# Patient Record
Sex: Female | Born: 1990 | Race: Asian | Hispanic: No | Marital: Single | Smoking: Former smoker
Health system: Southern US, Community
[De-identification: ages and names within clinical notes are randomized; demographics above are authoritative.]

## PROBLEM LIST (undated history)

## (undated) ENCOUNTER — Inpatient Hospital Stay (HOSPITAL_COMMUNITY): Payer: Self-pay

## (undated) DIAGNOSIS — M5432 Sciatica, left side: Secondary | ICD-10-CM

## (undated) DIAGNOSIS — Z789 Other specified health status: Secondary | ICD-10-CM

## (undated) DIAGNOSIS — I499 Cardiac arrhythmia, unspecified: Secondary | ICD-10-CM

## (undated) HISTORY — DX: Other specified health status: Z78.9

## (undated) HISTORY — DX: Cardiac arrhythmia, unspecified: I49.9

## (undated) HISTORY — DX: Sciatica, left side: M54.32

## (undated) HISTORY — PX: NO PAST SURGERIES: SHX2092

---

## 2017-02-11 ENCOUNTER — Emergency Department (HOSPITAL_COMMUNITY)
Admission: EM | Admit: 2017-02-11 | Discharge: 2017-02-11 | Disposition: A | Discharge status: Home / Self Care | Payer: Self-pay | Attending: Emergency Medicine | Admitting: Emergency Medicine

## 2017-02-11 ENCOUNTER — Encounter (HOSPITAL_COMMUNITY): Payer: Self-pay

## 2017-02-11 DIAGNOSIS — R21 Rash and other nonspecific skin eruption: Secondary | ICD-10-CM

## 2017-02-11 DIAGNOSIS — L539 Erythematous condition, unspecified: Secondary | ICD-10-CM | POA: Insufficient documentation

## 2017-02-11 DIAGNOSIS — L299 Pruritus, unspecified: Secondary | ICD-10-CM | POA: Insufficient documentation

## 2017-02-11 MED ORDER — HYDROXYZINE HCL 25 MG PO TABS
25.0000 mg | ORAL_TABLET | Freq: Four times a day (QID) | ORAL | 0 refills | Status: DC
Start: 2017-02-11 — End: 2017-06-23

## 2017-02-11 MED ORDER — TRIAMCINOLONE ACETONIDE 0.1 % EX CREA: 1.0000 "application " | TOPICAL_CREAM | Freq: Two times a day (BID) | CUTANEOUS | 0 refills | Status: DC

## 2017-02-11 NOTE — Discharge Instructions (Signed)
Apply kenalog cream twice a day to help get rid of rash. Use Hydroxyzine every 6hrs as needed for itching. Please return to the ER if symptoms worsen, you have trouble breathing.

## 2017-02-11 NOTE — ED Triage Notes (Signed)
Pt endorses recently buying a puppy and thinks she has been getting bit by fleas. Pt states that she had small bug bites but now has rash on there left neck. Airway intact. NKA. VSS.

## 2017-02-11 NOTE — ED Provider Notes (Signed)
[facility]-EMERGENCY DEPT Provider Note   CSN: [id number] Arrival date & time: 02/11/17  1343     History   Chief Complaint Chief Complaint  Patient presents with  . Rash    HPI Veronica Hensley is a 26 y.o. female.  HPI   Veronica Hensley is a 26yo female with no significant past medical history who presents to the Emergency Department for evaluation of a rash. She states that rash began on the left side of her neck last week, shortly after she got a puppy. The rash is itchy and has spread around the neck, up to her ears. She has also noticed it on her stomach, back and on areas of her thighs. She lives in a house with many family members who have access to the puppy, none of which have developed a rash. Puppy does not seem to be itching himself and was cleared by a vet a few days ago. She denies new soaps, detergents, lotions, makeup products, or other topical products. She has not traveled outside the [place], nor has she spent time in the wilderness lately. No new medications. She does not have any known allergies, has not had a rash like this before. She denies shortness of breath, fever, chest pain, nausea, vomiting.  History reviewed. No pertinent past medical history.  There are no active problems to display for this patient.   History reviewed. No pertinent surgical history.  OB History    No data available       Home Medications    Prior to Admission medications   Medication Sig Start Date End Date Taking? Authorizing Provider  hydrOXYzine (ATARAX/VISTARIL) 25 MG tablet Take 1 tablet (25 mg total) by mouth every 6 (six) hours. 02/11/17   Kellie Shropshire, PA-C  triamcinolone cream (KENALOG) 0.1 % Apply 1 application topically 2 (two) times daily. 02/11/17   Kellie Shropshire, PA-C    Family History History reviewed. No pertinent family history.  Social History Social History  Substance Use Topics  . Smoking status: Never Smoker  . Smokeless tobacco: Not on file  . Alcohol use Yes       Comment: occ     Allergies   Patient has no known allergies.   Review of Systems Review of Systems  Constitutional: Negative for chills and fever.  HENT: Negative for ear pain and hearing loss.   Eyes: Negative for pain and discharge.  Respiratory: Negative for shortness of breath, wheezing and stridor.   Cardiovascular: Negative for chest pain.  Gastrointestinal: Negative for nausea and vomiting.  Musculoskeletal: Negative for arthralgias and myalgias.  Skin: Positive for rash (erythematous and located on neck, stomach, back, thighs).     Physical Exam Updated Vital Signs BP 117/79 (BP Location: Left Arm)   Pulse 67   Temp 98.3 F (36.8 C) (Oral)   Resp 17   Ht 5\' 6"  (1.676 m)   Wt 104.3 kg (230 lb)   LMP 02/04/2017 (Exact Date)   SpO2 99%   BMI 37.12 kg/m   Physical Exam  Constitutional: She appears well-developed and well-nourished. No distress.  HENT:  Head: Normocephalic and atraumatic.  Right Ear: External ear normal.  Left Ear: External ear normal.  Eyes: Right eye exhibits no discharge. Left eye exhibits no discharge.  Neck: Full passive range of motion without pain. Neck supple.  Dark, velvety area on the skin on the back of the neck.   Pulmonary/Chest: Effort normal. No respiratory distress.  Neurological: She is alert. Coordination  normal.  Skin: No abrasion, no bruising, no laceration and no petechiae noted. She is not diaphoretic.  Erythematous, patchy rash located on the left side of the neck which spreads to the right side of the neck. Small area of lichenification. See picture below. Red, spotted rash on middle portion of the abdomen. See picture.   Psychiatric: She has a normal mood and affect. Her behavior is normal.  Nursing note and vitals reviewed.          ED Treatments / Results  Labs (all labs ordered are listed, but only abnormal results are displayed) Labs Reviewed - No data to display  EKG  EKG Interpretation None        Radiology No results found.  Procedures Procedures (including critical care time)  Medications Ordered in ED Medications - No data to display   Initial Impression / Assessment and Plan / ED Course  I have reviewed the triage vital signs and the nursing notes.  Pertinent labs & imaging results that were available during my care of the patient were reviewed by me and considered in my medical decision making (see chart for details).   [name] presented to the ER for evaluation of erythematous, pruritic rash located on the neck and stomach which developed 7 days ago and has been worsening. VSS, no fever or shortness of breath. She denies allergies or recent exposure to new medications or topical agents. This is likely a contact dermatitis that will be responsive to corticosteroids. It is not target like to suggest an arthropod exposure. Furthermore there are no spots with central clearing to suggest fungal exposure. Patient given topical steroid cream to apply twice daily for rash and hydroxyzine as needed for itching.   Final Clinical Impressions(s) / ED Diagnoses   Final diagnoses:  Rash and nonspecific skin eruption    New Prescriptions New Prescriptions   HYDROXYZINE (ATARAX/VISTARIL) 25 MG TABLET    Take 1 tablet (25 mg total) by mouth every 6 (six) hours.   TRIAMCINOLONE CREAM (KENALOG) 0.1 %    Apply 1 application topically 2 (two) times daily.     Kellie ShropshireShrosbree, [name], PA-C 02/11/17 1644    Arby BarrettePfeiffer, Marcy, MD 02/19/17 [id number]

## 2017-06-23 ENCOUNTER — Encounter (HOSPITAL_COMMUNITY): Payer: Self-pay | Admitting: Emergency Medicine

## 2017-06-23 ENCOUNTER — Other Ambulatory Visit: Payer: Self-pay

## 2017-06-23 ENCOUNTER — Emergency Department (HOSPITAL_COMMUNITY)
Admission: EM | Admit: 2017-06-23 | Discharge: 2017-06-23 | Disposition: A | Discharge status: Home / Self Care | Payer: Self-pay | Attending: Emergency Medicine | Admitting: Emergency Medicine

## 2017-06-23 DIAGNOSIS — L0100 Impetigo, unspecified: Secondary | ICD-10-CM | POA: Insufficient documentation

## 2017-06-23 DIAGNOSIS — L219 Seborrheic dermatitis, unspecified: Secondary | ICD-10-CM | POA: Insufficient documentation

## 2017-06-23 MED ORDER — TRIAMCINOLONE ACETONIDE 0.025 % EX OINT: 1.0000 "application " | TOPICAL_OINTMENT | Freq: Two times a day (BID) | CUTANEOUS | 0 refills | Status: DC

## 2017-06-23 MED ORDER — CEPHALEXIN 250 MG PO CAPS: 250.0000 mg | ORAL_CAPSULE | Freq: Four times a day (QID) | ORAL | 0 refills | Status: DC

## 2017-06-23 MED ORDER — OXICONAZOLE NITRATE 1 % EX CREA: TOPICAL_CREAM | CUTANEOUS | 0 refills | Status: DC

## 2017-06-23 NOTE — ED Provider Notes (Signed)
[facility]   CSN: [id number] Arrival date & time: 06/23/17  1312     History   Chief Complaint Chief Complaint  Patient presents with  . Rash    HPI Veronica Hensley is a 26 y.o. female resents to the emergency department with a chief complaint of rash that began on her neck 4 days ago before spreading to her face and scalp 3 days ago.  She also notes the rash is over the dorsum of her bilateral hands.  She denies rash over the trunk, legs, arms except for the hands, palms or soles.  It is pruritic, but not painful.  No history of similar.  She is treated her symptoms by putting diluted tea tree oil over the rash without improvement.  She denies fever, chills, URI symptoms, or recent illness.  No contacts with similar rash.  No new soaps, detergents, foods, sleeping environments, or hygiene products.  She is not immunocompromised.  No family history of inflammatory or rheumatological conditions.  The history is provided by the patient. No language interpreter was used.    History reviewed. No pertinent past medical history.  There are no active problems to display for this patient.   History reviewed. No pertinent surgical history.  OB History    No data available       Home Medications    Prior to Admission medications   Medication Sig Start Date End Date Taking? Authorizing Provider  cephALEXin (KEFLEX) 250 MG capsule Take 1 capsule (250 mg total) by mouth 4 (four) times daily for 7 days. 06/23/17 06/30/17  Ludmila Ebarb A, PA-C  oxiconazole (OXISTAT) 1 % CREA Apply a thin film twice per day. 06/23/17   Posie Lillibridge A, PA-C  triamcinolone (KENALOG) 0.025 % ointment Apply 1 application topically 2 (two) times daily. 06/23/17   Hannan Tetzlaff, Pedro EarlsMia A, PA-C    Family History No family history on file.  Social History Social History   Tobacco Use  . Smoking status: Never Smoker  . Smokeless tobacco: Never Used  Substance Use  Topics  . Alcohol use: Yes    Comment: occ  . Drug use: No     Allergies   Patient has no known allergies.   Review of Systems Review of Systems  Constitutional: Negative for chills and fever.  HENT: Negative for congestion, ear pain, rhinorrhea and sore throat.   Respiratory: Negative for shortness of breath.   Skin: Positive for rash.  Allergic/Immunologic: Negative for immunocompromised state.     Physical Exam Updated Vital Signs BP 118/73 (BP Location: Right Arm)   Pulse (!) 59   Temp 97.8 F (36.6 C) (Oral)   Resp 16   Ht 5\' 6"  (1.676 m)   Wt 104.3 kg (230 lb)   LMP 05/30/2017   SpO2 100%   BMI 37.12 kg/m   Physical Exam  Constitutional: No distress.  HENT:  Head: Normocephalic.    See pictures below.  Eyes: Conjunctivae are normal.  Neck: Neck supple.  Cardiovascular: Normal rate and regular rhythm. Exam reveals no gallop and no friction rub.  No murmur heard. Pulmonary/Chest: Effort normal. No respiratory distress.  Abdominal: Soft. She exhibits no distension.  Neurological: She is alert.  Skin: Skin is warm. Rash noted.  Psychiatric: Her behavior is normal.  Nursing note and vitals reviewed.   Left neck   Face   Right neck    Posterior neck     ED Treatments / Results  Labs (all labs ordered are listed, but only abnormal results are displayed) Labs Reviewed - No data to display  EKG  EKG Interpretation None       Radiology No results found.  Procedures Procedures (including critical care time)  Medications Ordered in ED Medications - No data to display   Initial Impression / Assessment and Plan / ED Course  I have reviewed the triage vital signs and the nursing notes.  Pertinent labs & imaging results that were available during my care of the patient were reviewed by me and considered in my medical decision making (see chart for details).     26 year old female presenting with a rash to the neck, face, scalp, and  dorsum of the bilateral hands.  The rash is only present on areas that have not been covered by clothing for the last few days.  She states there has been a scaling plaque to the left neck that has come and gone for some time.  Patient was discussed with Dr. Erma HeritageIsaacs, attending physician.  The rash looks more consistent for 2 separate rashes, impetigo and seborrheic dermatitis.  Will discharge with Keflex for impetigo and oxiconazole and triamcinolone cream as well as a selenium shampoo for seborrheic dermatitis.  Will also provide follow-up for dermatology if her rash is not improved.  Strict return precautions given.  No acute distress.  Patient is safe for discharge at this time.  Final Clinical Impressions(s) / ED Diagnoses   Final diagnoses:  Impetigo  Dermatitis, seborrheic    ED Discharge Orders        Ordered    cephALEXin (KEFLEX) 250 MG capsule  4 times daily     06/23/17 1626    oxiconazole (OXISTAT) 1 % CREA     06/23/17 1626    triamcinolone (KENALOG) 0.025 % ointment  2 times daily     06/23/17 1626       Matias Thurman A, PA-C 06/23/17 Berna [id number]    Shaune PollackIsaacs, Cameron, MD 06/24/17 [phone]

## 2017-06-23 NOTE — Discharge Instructions (Signed)
Your rash appears consistent with 2 different types of rashes.  Please take 1 tablet of Keflex every 6 hours for the next 7 days.  This is an antibiotic that is used to treat impetigo.   For the rash that is not red around the eyes and is also present on the neck there are 2 creams to use for this.  Please apply a thin film of oxiconazole and a thin layer of triamcinolone cream to the rash twice daily until it improves.  Please make sure that you do not get this cream in or around the eyes.  For the scalp, you can purchase that shampoo over-the-counter such as head and shoulders or another shampoo that is designed for dandruff that contains selenium as an active ingredient.  Use as directed on the bottle.  If you develop new or worsening symptoms including, changes to your vision, if it feels like there are changes in her hearing, fever, chills, or if the rash significantly worsens, please return to the emergency department for reevaluation.  If the rash does not seem as if it starting to improve within the next week, please call to schedule a follow-up appointment with dermatology.

## 2017-06-23 NOTE — ED Triage Notes (Signed)
Rash started on face on Sunday-- has gotten worse, now is on back of neck -- itching, has been taking benadryl -- last dose at 3am, 50mg . Eyes swollen, , rash on face, back of neck. Pt has not changed soap, lotion, make up,

## 2017-06-25 ENCOUNTER — Emergency Department (HOSPITAL_COMMUNITY)
Admission: EM | Admit: 2017-06-25 | Discharge: 2017-06-25 | Disposition: A | Discharge status: Home / Self Care | Payer: Self-pay | Attending: Emergency Medicine | Admitting: Emergency Medicine

## 2017-06-25 ENCOUNTER — Encounter (HOSPITAL_COMMUNITY): Payer: Self-pay | Admitting: *Deleted

## 2017-06-25 ENCOUNTER — Other Ambulatory Visit: Payer: Self-pay

## 2017-06-25 DIAGNOSIS — Y829 Unspecified medical devices associated with adverse incidents: Secondary | ICD-10-CM | POA: Insufficient documentation

## 2017-06-25 DIAGNOSIS — R600 Localized edema: Secondary | ICD-10-CM | POA: Insufficient documentation

## 2017-06-25 DIAGNOSIS — L299 Pruritus, unspecified: Secondary | ICD-10-CM | POA: Insufficient documentation

## 2017-06-25 DIAGNOSIS — T7840XA Allergy, unspecified, initial encounter: Secondary | ICD-10-CM

## 2017-06-25 DIAGNOSIS — T887XXA Unspecified adverse effect of drug or medicament, initial encounter: Secondary | ICD-10-CM | POA: Insufficient documentation

## 2017-06-25 LAB — CBC WITH DIFFERENTIAL/PLATELET
BASOS ABS: 0 10*3/uL (ref 0.0–0.1)
Basophils Relative: 0 %
EOS ABS: 0.5 10*3/uL (ref 0.0–0.7)
EOS PCT: 5 %
HCT: 46.7 % — ABNORMAL HIGH (ref 36.0–46.0)
HEMOGLOBIN: 15.4 g/dL — AB (ref 12.0–15.0)
LYMPHS ABS: 2.3 10*3/uL (ref 0.7–4.0)
LYMPHS PCT: 24 %
MCH: 26 pg (ref 26.0–34.0)
MCHC: 33 g/dL (ref 30.0–36.0)
MCV: 78.8 fL (ref 78.0–100.0)
Monocytes Absolute: 0.6 10*3/uL (ref 0.1–1.0)
Monocytes Relative: 6 %
Neutro Abs: 6.4 10*3/uL (ref 1.7–7.7)
Neutrophils Relative %: 65 %
PLATELETS: 257 10*3/uL (ref 150–400)
RBC: 5.93 MIL/uL — AB (ref 3.87–5.11)
RDW: 14.5 % (ref 11.5–15.5)
WBC: 9.8 10*3/uL (ref 4.0–10.5)

## 2017-06-25 LAB — BASIC METABOLIC PANEL
Anion gap: 14 (ref 5–15)
BUN: 10 mg/dL (ref 6–20)
CHLORIDE: 105 mmol/L (ref 101–111)
CO2: 19 mmol/L — ABNORMAL LOW (ref 22–32)
CREATININE: 0.7 mg/dL (ref 0.44–1.00)
Calcium: 9.7 mg/dL (ref 8.9–10.3)
GFR calc Af Amer: >60 mL/min (ref >60)
Glucose, Bld: 89 mg/dL (ref 65–99)
POTASSIUM: 3.7 mmol/L (ref 3.5–5.1)
SODIUM: 138 mmol/L (ref 135–145)

## 2017-06-25 LAB — I-STAT BETA HCG BLOOD, ED (MC, WL, AP ONLY)

## 2017-06-25 MED ORDER — DIPHENHYDRAMINE HCL 50 MG/ML IJ SOLN
25.0000 mg | Freq: Once | INTRAMUSCULAR | Status: DC
  Filled 2017-06-25: qty 1

## 2017-06-25 MED ORDER — DIPHENHYDRAMINE HCL 25 MG PO CAPS
25.0000 mg | ORAL_CAPSULE | Freq: Once | ORAL | Status: AC
  Administered 2017-06-25: 25 mg via ORAL
  Filled 2017-06-25: qty 1

## 2017-06-25 MED ORDER — PREDNISONE 20 MG PO TABS: ORAL_TABLET | ORAL | 0 refills | Status: DC

## 2017-06-25 MED ORDER — DOXYCYCLINE HYCLATE 100 MG PO CAPS: 100.0000 mg | ORAL_CAPSULE | Freq: Two times a day (BID) | ORAL | 0 refills | Status: DC

## 2017-06-25 MED ORDER — PREDNISONE 20 MG PO TABS
60.0000 mg | ORAL_TABLET | Freq: Once | ORAL | Status: AC
  Administered 2017-06-25: 60 mg via ORAL
  Filled 2017-06-25: qty 3

## 2017-06-25 MED ORDER — FAMOTIDINE 20 MG PO TABS
20.0000 mg | ORAL_TABLET | Freq: Once | ORAL | Status: AC
  Administered 2017-06-25: 20 mg via ORAL
  Filled 2017-06-25: qty 1

## 2017-06-25 MED ORDER — DIPHENHYDRAMINE HCL 25 MG PO TABS: 25.0000 mg | ORAL_TABLET | Freq: Four times a day (QID) | ORAL | 0 refills | Status: DC

## 2017-06-25 NOTE — Discharge Instructions (Signed)
Your facial swelling may be due to an allergic reaction.  Please discontinue Keflex and take doxycycline as prescribed.  Take prednisone and benadryl to help with the reaction.  Follow up with dermatologist if your condition does not improve.  Return if your condition worsen or if you have trouble breathing.

## 2017-06-25 NOTE — ED Provider Notes (Signed)
[facility] Provider Note   CSN: [id number] Arrival date & time: 06/25/17  1429     History   Chief Complaint Chief Complaint  Patient presents with  . Allergic Reaction    HPI Veronica Hensley is a 26 y.o. female.  HPI   26 year old female presenting for symptom of a rash.  Patient report 6 days ago she developed an slightly itchy rash since swelling to the back of her neck, and torso phased.  She also notes that on the dorsum of her hand.  It is not painful.  She did not know of anything that may have precipitated the rash.  No change in soap, detergent, new pets, new medication.  She did try diluted tea tree oil over the rash without adequate improvement.  She denies any recent sickness and no one else at home with similar rash.  She was seen in the ER on the 25th of this month for her symptoms.  It was felt that the rash may be impetigo or seborrheic dermatitis.  Patient discharged home with Keflex, and oxiconazole and triamcinolone cream as well as selenium shampoo.  She has been using the medication as prescribed but returned today after noticing increasing swelling to her face especially around her eyes.  Also endorses itchiness.  No report of fever, lightheadedness, dizziness, chest pain, trouble breathing, tongue swelling, trouble swallowing, abdominal cramping, or joint pain.  She denies taking any other over-the-counter medication such as ibuprofen, BC powders, Goody's powders, or Tylenol.  History reviewed. No pertinent past medical history.  There are no active problems to display for this patient.   History reviewed. No pertinent surgical history.  OB History    No data available       Home Medications    Prior to Admission medications   Medication Sig Start Date End Date Taking? Authorizing Provider  cephALEXin (KEFLEX) 250 MG capsule Take 1 capsule (250 mg total) by mouth 4 (four) times daily for 7 days. 06/23/17 06/30/17   McDonald, Mia A, PA-C  oxiconazole (OXISTAT) 1 % CREA Apply a thin film twice per day. 06/23/17   McDonald, Mia A, PA-C  triamcinolone (KENALOG) 0.025 % ointment Apply 1 application topically 2 (two) times daily. 06/23/17   McDonald, Pedro EarlsMia A, PA-C    Family History No family history on file.  Social History Social History   Tobacco Use  . Smoking status: Never Smoker  . Smokeless tobacco: Never Used  Substance Use Topics  . Alcohol use: Yes    Comment: occ  . Drug use: No     Allergies   Patient has no known allergies.   Review of Systems Review of Systems  All other systems reviewed and are negative.    Physical Exam Updated Vital Signs BP 131/89 (BP Location: Right Arm)   Pulse 79   Temp 98.2 F (36.8 C) (Oral)   Resp 16   Ht 5\' 6"  (1.676 m)   Wt 104.3 kg (230 lb)   LMP 05/30/2017   SpO2 100%   BMI 37.12 kg/m   Physical Exam  Constitutional: She is oriented to person, place, and time. She appears well-developed and well-nourished. No distress.  Obese female resting comfortably in bed in no acute discomfort.  HENT:  Head: Atraumatic.  Mouth/Throat: Oropharynx is clear and moist.  Eyes: Conjunctivae and EOM are normal. Pupils are equal, round, and reactive to light.  Neck: Normal range of motion. Neck supple.  Cardiovascular: Normal rate.  Irregular heart rhythm without murmur rubs or gallops  Pulmonary/Chest: Effort normal and breath sounds normal. She has no wheezes.  Abdominal: Soft. There is no tenderness.  Neurological: She is alert and oriented to person, place, and time.  Skin: Rash (Maculopapular erythematous rash noted surrounding patient's neck, anterior face around both orbital region with moderate edema to face.  Similar rash noted to the dorsum of her hands) noted.  No rash on the palms of hands, or soles of the feet.  No mucosal involvement.  Psychiatric: She has a normal mood and affect.  Nursing note and vitals reviewed.    ED Treatments  / Results  Labs (all labs ordered are listed, but only abnormal results are displayed) Labs Reviewed  BASIC METABOLIC PANEL - Abnormal; Notable for the following components:      Result Value   CO2 19 (*)    All other components within normal limits  CBC WITH DIFFERENTIAL/PLATELET - Abnormal; Notable for the following components:   RBC 5.93 (*)    Hemoglobin 15.4 (*)    HCT 46.7 (*)    All other components within normal limits  I-STAT BETA HCG BLOOD, ED ([facility], [facility], [facility] ONLY)    EKG  EKG Interpretation None      Date: 06/25/2017  Rate: 65  Rhythm: normal sinus rhythm  QRS Axis: normal  Intervals: normal  ST/T Wave abnormalities: normal  Conduction Disutrbances: none  Narrative Interpretation:   Old EKG Reviewed: No significant changes noted     Radiology No results found.  Procedures Procedures (including critical care time)  Medications Ordered in ED Medications  predniSONE (DELTASONE) tablet 60 mg (60 mg Oral Given 06/25/17 1550)  famotidine (PEPCID) tablet 20 mg (20 mg Oral Given 06/25/17 1550)  diphenhydrAMINE (BENADRYL) capsule 25 mg (25 mg Oral Given 06/25/17 1550)     Initial Impression / Assessment and Plan / ED Course  I have reviewed the triage vital signs and the nursing notes.  Pertinent labs & imaging results that were available during my care of the patient were reviewed by me and considered in my medical decision making (see chart for details).     BP 131/75   Pulse 65   Temp 98.2 F (36.8 C) (Oral)   Resp 17   Ht 5\' 6"  (1.676 m)   Wt 104.3 kg (230 lb)   LMP 05/30/2017   SpO2 100%   BMI 37.12 kg/m    Final Clinical Impressions(s) / ED Diagnoses   Final diagnoses:  Allergic reaction to drug, initial encounter    ED Discharge Orders        Ordered    predniSONE (DELTASONE) 20 MG tablet     06/25/17 1913    diphenhydrAMINE (BENADRYL) 25 MG tablet  Every 6 hours     06/25/17 1913    doxycycline (VIBRAMYCIN) 100 MG capsule  2 times  daily     06/25/17 1913     5:45 PM Patient here with worsening swelling and worsening rash most noticeable to her face.  On exam, both eyelids are puffy and she has prominent rash across her face, neck, and dorsums of hands.  No rash in the trunk or chest.  No mucosal involvement, no anaphylactic reaction.  She was recently started on 3 different medication, Keflex, oxiconazole, and triamcinolone.  It is difficult to determine if this increased swelling is due to an allergic reaction from the new medication versus the precautions of her rash.  Patient started on prednisone,  Benadryl, and Pepcid.  Will monitor closely.  She had an abnormal heart rhythm on exam which I mentioned to her and she acknowledged that other providers have mentioned the same.  An EKG obtained today shown sinus rhythm without any concerning arrhythmia.  6:43 PM Patient has been monitored in the ER with some improvement of her symptoms.  The swelling on the face has decreased.  Patient reports she used Keflex and triamcinolone cream only.  She did not use oxiconazole.  Since patient still at risk for infection, and she potentially developed allergic reaction to Keflex, I recommend discontinue Keflex and will switch her to doxycycline for further treatment.  Her pregnancy test is negative.  Labs are reassuring.  Return precautions discussed.   Fayrene Helper, PA-C 06/25/17 Lessie Dings, MD 06/26/17 1520

## 2017-06-25 NOTE — ED Triage Notes (Signed)
Pt tx here for impetigo on Dec 25 and has been taking antibiotics.  Pt has been taking prescribed oral antibiotics.  Now eyes are almost completely shut.  Denies any difficulty breathing.

## 2017-06-25 NOTE — ED Notes (Signed)
ED Provider at bedside. 

## 2017-06-25 NOTE — ED Provider Notes (Signed)
Patient placed in Quick Look pathway, seen and evaluated for chief complaint of facial swelling/ allergic rxn.  Pertinent H&P findings include recently dx with impetigo, tx with keflex and kenalog, eyes swollen shut. No signs of anaphylaxis, no mucous membrane involvement.  Based on initial evaluation, labs are indicated and radiology studies are not indicated.  Patient counseled on process, plan, and necessity for staying for completing the evaluation    Arthor CaptainHarris, Devin Foskey, PA-C 06/25/17 1653    Margarita Grizzleay, Danielle, MD 06/26/17 [phone]

## 2018-02-04 ENCOUNTER — Encounter (HOSPITAL_COMMUNITY): Payer: Self-pay

## 2018-04-12 ENCOUNTER — Encounter: Payer: Self-pay | Admitting: Obstetrics & Gynecology

## 2018-04-26 ENCOUNTER — Ambulatory Visit (INDEPENDENT_AMBULATORY_CARE_PROVIDER_SITE_OTHER): Payer: Medicaid Other | Admitting: Advanced Practice Midwife

## 2018-04-26 ENCOUNTER — Encounter: Payer: Self-pay | Admitting: Advanced Practice Midwife

## 2018-04-26 ENCOUNTER — Other Ambulatory Visit (HOSPITAL_COMMUNITY)
Admission: RE | Admit: 2018-04-26 | Discharge: 2018-04-26 | Disposition: A | Discharge status: Home / Self Care | Payer: Medicaid Other | Source: Ambulatory Visit | Attending: Obstetrics & Gynecology | Admitting: Obstetrics & Gynecology

## 2018-04-26 VITALS — BP 118/80 | HR 50 | Wt 240.0 lb

## 2018-04-26 DIAGNOSIS — Z3A00 Weeks of gestation of pregnancy not specified: Secondary | ICD-10-CM | POA: Diagnosis not present

## 2018-04-26 DIAGNOSIS — O0932 Supervision of pregnancy with insufficient antenatal care, second trimester: Secondary | ICD-10-CM | POA: Diagnosis not present

## 2018-04-26 DIAGNOSIS — Z3490 Encounter for supervision of normal pregnancy, unspecified, unspecified trimester: Secondary | ICD-10-CM

## 2018-04-26 DIAGNOSIS — O219 Vomiting of pregnancy, unspecified: Secondary | ICD-10-CM

## 2018-04-26 DIAGNOSIS — I499 Cardiac arrhythmia, unspecified: Secondary | ICD-10-CM

## 2018-04-26 MED ORDER — DOXYLAMINE-PYRIDOXINE 10-10 MG PO TBEC
DELAYED_RELEASE_TABLET | ORAL | 5 refills | Status: DC
Start: 2018-04-26 — End: 2018-06-20

## 2018-04-26 NOTE — Patient Instructions (Addendum)

## 2018-04-26 NOTE — Progress Notes (Signed)
   PRENATAL VISIT NOTE  Subjective:  Veronica Hensley is a 27 y.o. G1P0 at [redacted]w[redacted]d being seen today for initial prenatal visit.  She is currently monitored for the following issues for this low-risk pregnancy and has Supervision of normal pregnancy on their problem list.  Patient reports nausea.  Contractions: Not present. Vag. Bleeding: None.  Movement: Absent. Denies leaking of fluid.   The following portions of the patient's history were reviewed and updated as appropriate: allergies, current medications, past family history, past medical history, past social history, past surgical history and problem list. Problem list updated.  Objective:   Vitals:   04/26/18 1102  BP: 118/80  Pulse: (!) 50  Weight: 108.9 kg    Fetal Status: Fetal Heart Rate (bpm): 145   Movement: Absent     VS reviewed, nursing note reviewed,  Constitutional: well developed, well nourished, no distress HEENT: normocephalic CV: normal rate Pulm/chest wall: normal effort Breast Exam:  right breast normal without mass, skin or nipple changes or axillary nodes, left breast normal without mass, skin or nipple changes or axillary nodes Abdomen: soft Neuro: alert and oriented x 3 Skin: warm, dry Psych: affect normal Pelvic exam: Cervix pink, visually closed, without lesion, scant white creamy discharge, vaginal walls and external genitalia normal Bimanual exam: Cervix 0/long/high, firm, anterior, neg CMT, uterus nontender, ~ 20 week size, adnexa without tenderness, enlargement, or mass   Assessment and Plan:  Pregnancy: G1P0 at [redacted]w[redacted]d  1. Encounter for supervision of normal pregnancy, antepartum, unspecified gravidity --Discussed and offered genetic screening options, including Quad screen/AFP, NIPS testing, and option to decline testing. Benefits/risks/alternatives reviewed. Pt aware that anatomy US is form of genetic screening with lower accuracy in detecting trisomies than blood work.  Pt chooses NIPS for genetic  screening today. --Anticipatory guidance about next visits/weeks of pregnancy given. - Obstetric Panel, Including HIV - Culture, OB Urine - Cytology - PAP - Genetic Screening - Hemoglobinopathy evaluation - Cystic Fibrosis Mutation 97 - SMN1 COPY NUMBER ANALYSIS (SMA Carrier Screen) - Enroll Patient in Babyscripts  2. Nausea in pregnancy --No vomiting but daily nausea --Rx for Diclegis, discussed starting with pm dose, add am and afternoon doses PRN.  3.  Irregular heartbeat (maternal) --Dynamap reads maternal HR as 42, manual pulse is 50. --Pt reports known low normal heartrate in the past --Pt asymptomatic and denies any SOB or chest pain.   --Auscultation of HR reveals mostly regular HR in low 50s but with some irregular or dropped beats, approximately 1 every 2 minutes.  This is likely pt baseline, sinus arrythmia vs PACs --Pt given precautions, reasons to return to care for further work-up  Preterm labor symptoms and general obstetric precautions including but not limited to vaginal bleeding, contractions, leaking of fluid and fetal movement were reviewed in detail with the patient. Please refer to After Visit Summary for other counseling recommendations.  No follow-ups on file.  No future appointments.  Sharen Counter, CNM

## 2018-04-27 LAB — CYTOLOGY - PAP
Adequacy: ABSENT
CHLAMYDIA, DNA PROBE: NEGATIVE
DIAGNOSIS: NEGATIVE
Neisseria Gonorrhea: NEGATIVE

## 2018-04-28 ENCOUNTER — Telehealth: Payer: Self-pay | Admitting: Advanced Practice Midwife

## 2018-04-28 ENCOUNTER — Telehealth: Payer: Self-pay

## 2018-04-28 LAB — OBSTETRIC PANEL, INCLUDING HIV
ANTIBODY SCREEN: NEGATIVE
BASOS: 1 %
Basophils Absolute: 0.1 10*3/uL (ref 0.0–0.2)
EOS (ABSOLUTE): 0.3 10*3/uL (ref 0.0–0.4)
Eos: 3 %
HIV Screen 4th Generation wRfx: NONREACTIVE
Hematocrit: 36.8 % (ref 34.0–46.6)
Hemoglobin: 11.8 g/dL (ref 11.1–15.9)
Hepatitis B Surface Ag: NEGATIVE
IMMATURE GRANS (ABS): 0.1 10*3/uL (ref 0.0–0.1)
IMMATURE GRANULOCYTES: 1 %
LYMPHS: 28 %
Lymphocytes Absolute: 2.8 10*3/uL (ref 0.7–3.1)
MCH: 25.2 pg — ABNORMAL LOW (ref 26.6–33.0)
MCHC: 32.1 g/dL (ref 31.5–35.7)
MCV: 79 fL (ref 79–97)
Monocytes Absolute: 0.7 10*3/uL (ref 0.1–0.9)
Monocytes: 7 %
NEUTROS PCT: 60 %
Neutrophils Absolute: 6 10*3/uL (ref 1.4–7.0)
Platelets: 268 10*3/uL (ref 150–450)
RBC: 4.68 x10E6/uL (ref 3.77–5.28)
RDW: 13.7 % (ref 12.3–15.4)
RPR Ser Ql: REACTIVE — AB
Rh Factor: POSITIVE
Rubella Antibodies, IGG: 1.96 index (ref >0.99)
WBC: 9.9 10*3/uL (ref 3.4–10.8)

## 2018-04-28 LAB — HEMOGLOBINOPATHY EVALUATION
HGB C: 0 %
HGB S: 0 %
HGB VARIANT: 0 %
Hemoglobin A2 Quantitation: 2.2 % (ref 1.8–3.2)
Hemoglobin F Quantitation: 0 % (ref 0.0–2.0)
Hgb A: 97.8 % (ref 96.4–98.8)

## 2018-04-28 LAB — RPR, QUANT+TP ABS (REFLEX): T Pallidum Abs: NEGATIVE

## 2018-04-28 NOTE — Telephone Encounter (Signed)
Attempt to call pt but phone not in services.  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  Need to find out if pt has ever been treated for syphilis so is this active new infection or old infection with elevated labs?  The pt needs to call to make appointment for treatment with [facility] at [phone].

## 2018-04-28 NOTE — Telephone Encounter (Signed)
-----   Message from Hurshel Party, CNM sent at 04/28/2018  3:31 PM EDT ----- Regarding: Positive syphilis test I saw this G1 on 10/28 for new OB visit. Her RPR is positive and the confirmatory test, TPAL, is also positive.  I attempted to call her but her phone is not in service.  I put a note in the chart and put it in her sticky note.  Can you please contact her again in case her phone is back on?  She needs to contact [facility] at [phone] for treatment.  If we can't reach her, we will let her know at her next prenatal visit.  Thank you.

## 2018-04-28 NOTE — Telephone Encounter (Signed)
Attempted to call patient. Phone is not in service.

## 2018-04-29 ENCOUNTER — Encounter (HOSPITAL_COMMUNITY): Payer: Self-pay

## 2018-04-29 LAB — URINE CULTURE, OB REFLEX

## 2018-04-29 LAB — CULTURE, OB URINE

## 2018-05-03 ENCOUNTER — Encounter: Payer: Self-pay | Admitting: Advanced Practice Midwife

## 2018-05-05 LAB — SMN1 COPY NUMBER ANALYSIS (SMA CARRIER SCREENING)

## 2018-05-05 LAB — CYSTIC FIBROSIS MUTATION 97: GENE DIS ANAL CARRIER INTERP BLD/T-IMP: NOT DETECTED

## 2018-05-06 ENCOUNTER — Other Ambulatory Visit (HOSPITAL_COMMUNITY): Payer: Self-pay | Admitting: *Deleted

## 2018-05-06 ENCOUNTER — Ambulatory Visit (HOSPITAL_COMMUNITY)
Admission: RE | Admit: 2018-05-06 | Discharge: 2018-05-06 | Disposition: A | Discharge status: Home / Self Care | Payer: Medicaid Other | Source: Ambulatory Visit | Attending: Advanced Practice Midwife | Admitting: Advanced Practice Midwife

## 2018-05-06 DIAGNOSIS — Z363 Encounter for antenatal screening for malformations: Secondary | ICD-10-CM | POA: Diagnosis not present

## 2018-05-06 DIAGNOSIS — Z362 Encounter for other antenatal screening follow-up: Secondary | ICD-10-CM

## 2018-05-06 DIAGNOSIS — Z3482 Encounter for supervision of other normal pregnancy, second trimester: Secondary | ICD-10-CM | POA: Diagnosis present

## 2018-05-06 DIAGNOSIS — O09212 Supervision of pregnancy with history of pre-term labor, second trimester: Secondary | ICD-10-CM | POA: Diagnosis not present

## 2018-05-06 DIAGNOSIS — Z3A22 22 weeks gestation of pregnancy: Secondary | ICD-10-CM | POA: Diagnosis not present

## 2018-05-06 DIAGNOSIS — Z3490 Encounter for supervision of normal pregnancy, unspecified, unspecified trimester: Secondary | ICD-10-CM

## 2018-05-08 ENCOUNTER — Encounter: Payer: Self-pay | Admitting: Advanced Practice Midwife

## 2018-05-08 DIAGNOSIS — R768 Other specified abnormal immunological findings in serum: Secondary | ICD-10-CM | POA: Insufficient documentation

## 2018-05-24 ENCOUNTER — Encounter: Payer: Self-pay | Admitting: Obstetrics

## 2018-05-24 ENCOUNTER — Ambulatory Visit (INDEPENDENT_AMBULATORY_CARE_PROVIDER_SITE_OTHER): Payer: Medicaid Other | Admitting: Obstetrics

## 2018-05-24 VITALS — BP 117/77 | HR 52 | Wt 245.8 lb

## 2018-05-24 DIAGNOSIS — Z3490 Encounter for supervision of normal pregnancy, unspecified, unspecified trimester: Secondary | ICD-10-CM

## 2018-05-24 DIAGNOSIS — M549 Dorsalgia, unspecified: Secondary | ICD-10-CM

## 2018-05-24 DIAGNOSIS — R768 Other specified abnormal immunological findings in serum: Secondary | ICD-10-CM

## 2018-05-24 DIAGNOSIS — B9689 Other specified bacterial agents as the cause of diseases classified elsewhere: Secondary | ICD-10-CM | POA: Insufficient documentation

## 2018-05-24 DIAGNOSIS — B961 Klebsiella pneumoniae [K. pneumoniae] as the cause of diseases classified elsewhere: Secondary | ICD-10-CM

## 2018-05-24 DIAGNOSIS — N390 Urinary tract infection, site not specified: Secondary | ICD-10-CM | POA: Insufficient documentation

## 2018-05-24 DIAGNOSIS — Z3492 Encounter for supervision of normal pregnancy, unspecified, second trimester: Secondary | ICD-10-CM

## 2018-05-24 MED ORDER — COMFORT FIT MATERNITY SUPP SM MISC: 0 refills | Status: DC

## 2018-05-24 MED ORDER — CEFUROXIME AXETIL 500 MG PO TABS: 500.0000 mg | ORAL_TABLET | Freq: Two times a day (BID) | ORAL | 0 refills | Status: DC

## 2018-05-24 NOTE — Progress Notes (Signed)
Patient reports good fetal movement with occasional uterine irritability.

## 2018-05-24 NOTE — Progress Notes (Signed)
   PRENATAL VISIT NOTE  Subjective:  Veronica Hensley is a 27 y.o. G1P0 at [redacted]w[redacted]d being seen today for ongoing prenatal care.  She is currently monitored for the following issues for this low-risk pregnancy and has Supervision of normal pregnancy and Biological false positive RPR test on their problem list.  Patient reports backache.  Contractions: Irritability. Vag. Bleeding: None.  Movement: Present. Denies leaking of fluid.   The following portions of the patient's history were reviewed and updated as appropriate: allergies, current medications, past family history, past medical history, past social history, past surgical history and problem list. Problem list updated.  Objective:   Vitals:   05/24/18 1122  BP: 117/77  Pulse: (!) 52  Weight: 245 lb 12.8 oz (111.5 kg)    Fetal Status: Fetal Heart Rate (bpm): 148   Movement: Present     General:  Alert, oriented and cooperative. Patient is in no acute distress.  Skin: Skin is warm and dry. No rash noted.   Cardiovascular: Normal heart rate noted  Respiratory: Normal respiratory effort, no problems with respiration noted  Abdomen: Soft, gravid, appropriate for gestational age.  Pain/Pressure: Absent     Pelvic: Cervical exam deferred        Extremities: Normal range of motion.  Edema: None  Mental Status: Normal mood and affect. Normal behavior. Normal judgment and thought content.   Assessment and Plan:  Pregnancy: G1P0 at 132w4d  1. Encounter for supervision of normal pregnancy, antepartum, unspecified gravidity - DOING WELL  2. Biological false positive RPR test  3. Urinary tract infection without hematuria, site unspecified Rx: - cefUROXime (CEFTIN) 500 MG tablet; Take 1 tablet (500 mg total) by mouth 2 (two) times daily with a meal.  Dispense: 14 tablet; Refill: 0  4. Backache without radiation Rx: - Elastic Bandages & Supports (COMFORT FIT MATERNITY SUPP SM) MISC; Wear as directed.  Dispense: 1 each; Refill: 0    Preterm  labor symptoms and general obstetric precautions including but not limited to vaginal bleeding, contractions, leaking of fluid and fetal movement were reviewed in detail with the patient. Please refer to After Visit Summary for other counseling recommendations.  Return in about 4 weeks (around 06/21/2018) for ROB, 2 hour OGTT.  Future Appointments  Date Time Provider Department Center  06/03/2018 10:45 AM [facility] US 2 [facility]    Coral Ceoharles Harper, MD

## 2018-05-24 NOTE — Patient Instructions (Addendum)
Second Trimester of Pregnancy The second trimester is from week 14 through week 27 (months 4 through 6). The second trimester is often a time when you feel your best. Your body has adjusted to being pregnant, and you begin to feel better physically. Usually, morning sickness has lessened or quit completely, you may have more energy, and you may have an increase in appetite. The second trimester is also a time when the fetus is growing rapidly. At the end of the sixth month, the fetus is about 9 inches long and weighs about 1 pounds. You will likely begin to feel the baby move (quickening) between 16 and 20 weeks of pregnancy. Body changes during your second trimester Your body continues to go through many changes during your second trimester. The changes vary from woman to woman.  Your weight will continue to increase. You will notice your lower abdomen bulging out.  You may begin to get stretch marks on your hips, abdomen, and breasts.  You may develop headaches that can be relieved by medicines. The medicines should be approved by your health care provider.  You may urinate more often because the fetus is pressing on your bladder.  You may develop or continue to have heartburn as a result of your pregnancy.  You may develop constipation because certain hormones are causing the muscles that push waste through your intestines to slow down.  You may develop hemorrhoids or swollen, bulging veins (varicose veins).  You may have back pain. This is caused by: ? Weight gain. ? Pregnancy hormones that are relaxing the joints in your pelvis. ? A shift in weight and the muscles that support your balance.  Your breasts will continue to grow and they will continue to become tender.  Your gums may bleed and may be sensitive to brushing and flossing.  Dark spots or blotches (chloasma, mask of pregnancy) may develop on your face. This will likely fade after the baby is born.  A dark line from your  belly button to the pubic area (linea nigra) may appear. This will likely fade after the baby is born.  You may have changes in your hair. These can include thickening of your hair, rapid growth, and changes in texture. Some women also have hair loss during or after pregnancy, or hair that feels dry or thin. Your hair will most likely return to normal after your baby is born.  What to expect at prenatal visits During a routine prenatal visit:  You will be weighed to make sure you and the fetus are growing normally.  Your blood pressure will be taken.  Your abdomen will be measured to track your baby's growth.  The fetal heartbeat will be listened to.  Any test results from the previous visit will be discussed.  Your health care provider may ask you:  How you are feeling.  If you are feeling the baby move.  If you have had any abnormal symptoms, such as leaking fluid, bleeding, severe headaches, or abdominal cramping.  If you are using any tobacco products, including cigarettes, chewing tobacco, and electronic cigarettes.  If you have any questions.  Other tests that may be performed during your second trimester include:  Blood tests that check for: ? Low iron levels (anemia). ? High blood sugar that affects pregnant women (gestational diabetes) between 24 and 28 weeks. ? Rh antibodies. This is to check for a protein on red blood cells (Rh factor).  Urine tests to check for infections, diabetes, or   protein in the urine.  An ultrasound to confirm the proper growth and development of the baby.  An amniocentesis to check for possible genetic problems.  Fetal screens for spina bifida and Down syndrome.  HIV (human immunodeficiency virus) testing. Routine prenatal testing includes screening for HIV, unless you choose not to have this test.  Follow these instructions at home: Medicines  Follow your health care provider's instructions regarding medicine use. Specific  medicines may be either safe or unsafe to take during pregnancy.  Take a prenatal vitamin that contains at least 600 micrograms (mcg) of folic acid.  If you develop constipation, try taking a stool softener if your health care provider approves. Eating and drinking  Eat a balanced diet that includes fresh fruits and vegetables, whole grains, good sources of protein such as meat, eggs, or tofu, and low-fat dairy. Your health care provider will help you determine the amount of weight gain that is right for you.  Avoid raw meat and uncooked cheese. These carry germs that can cause birth defects in the baby.  If you have low calcium intake from food, talk to your health care provider about whether you should take a daily calcium supplement.  Limit foods that are high in fat and processed sugars, such as fried and sweet foods.  To prevent constipation: ? Drink enough fluid to keep your urine clear or pale yellow. ? Eat foods that are high in fiber, such as fresh fruits and vegetables, whole grains, and beans. Activity  Exercise only as directed by your health care provider. Most women can continue their usual exercise routine during pregnancy. Try to exercise for 30 minutes at least 5 days a week. Stop exercising if you experience uterine contractions.  Avoid heavy lifting, wear low heel shoes, and practice good posture.  A sexual relationship may be continued unless your health care provider directs you otherwise. Relieving pain and discomfort  Wear a good support bra to prevent discomfort from breast tenderness.  Take warm sitz baths to soothe any pain or discomfort caused by hemorrhoids. Use hemorrhoid cream if your health care provider approves.  Rest with your legs elevated if you have leg cramps or low back pain.  If you develop varicose veins, wear support hose. Elevate your feet for 15 minutes, 3-4 times a day. Limit salt in your diet. Prenatal Care  Write down your questions.  Take them to your prenatal visits.  Keep all your prenatal visits as told by your health care provider. This is important. Safety  Wear your seat belt at all times when driving.  Make a list of emergency phone numbers, including numbers for family, friends, the hospital, and police and fire departments. General instructions  Ask your health care provider for a referral to a local prenatal education class. Begin classes no later than the beginning of month 6 of your pregnancy.  Ask for help if you have counseling or nutritional needs during pregnancy. Your health care provider can offer advice or refer you to specialists for help with various needs.  Do not use hot tubs, steam rooms, or saunas.  Do not douche or use tampons or scented sanitary pads.  Do not cross your legs for long periods of time.  Avoid cat litter boxes and soil used by cats. These carry germs that can cause birth defects in the baby and possibly loss of the fetus by miscarriage or stillbirth.  Avoid all smoking, herbs, alcohol, and unprescribed drugs. Chemicals in these products can   affect the formation and growth of the baby.  Do not use any products that contain nicotine or tobacco, such as cigarettes and e-cigarettes. If you need help quitting, ask your health care provider.  Visit your dentist if you have not gone yet during your pregnancy. Use a soft toothbrush to brush your teeth and be gentle when you floss. Contact a health care provider if:  You have dizziness.  You have mild pelvic cramps, pelvic pressure, or nagging pain in the abdominal area.  You have persistent nausea, vomiting, or diarrhea.  You have a bad smelling vaginal discharge.  You have pain when you urinate. Get help right away if:  You have a fever.  You are leaking fluid from your vagina.  You have spotting or bleeding from your vagina.  You have severe abdominal cramping or pain.  You have rapid weight gain or weight  loss.  You have shortness of breath with chest pain.  You notice sudden or extreme swelling of your face, hands, ankles, feet, or legs.  You have not felt your baby move in over an hour.  You have severe headaches that do not go away when you take medicine.  You have vision changes. Summary  The second trimester is from week 14 through week 27 (months 4 through 6). It is also a time when the fetus is growing rapidly.  Your body goes through many changes during pregnancy. The changes vary from woman to woman.  Avoid all smoking, herbs, alcohol, and unprescribed drugs. These chemicals affect the formation and growth your baby.  Do not use any tobacco products, such as cigarettes, chewing tobacco, and e-cigarettes. If you need help quitting, ask your health care provider.  Contact your health care provider if you have any questions. Keep all prenatal visits as told by your health care provider. This is important. This information is not intended to replace advice given to you by your health care provider. Make sure you discuss any questions you have with your health care provider. Document Released: 06/10/2001 Document Revised: 07/22/2016 Document Reviewed: 07/22/2016 Elsevier Interactive Patient Education  2018 [employer] Glucose Tolerance Test During Pregnancy The glucose tolerance test (GTT) is a blood test used to determine if you have developed a type of diabetes during pregnancy (gestational diabetes). This is when your body does not properly process sugar (glucose) in the food you eat, resulting in high blood glucose levels. Typically, a GTT is done after you have had a 1-hour glucose test with results that indicate you possibly have gestational diabetes. It may also be done if:  You have a history of giving birth to very large babies or have experienced repeated fetal loss (stillbirth).  You have signs and symptoms of diabetes, such as: ? Changes in your vision. ? Tingling or  numbness in your hands or feet. ? Changes in hunger, thirst, and urination not otherwise explained by your pregnancy.  The GTT lasts about 3 hours. You will be given a sugar-water solution to drink at the beginning of the test. You will have blood drawn before you drink the solution and then again 1, 2, and 3 hours after you drink it. You will not be allowed to eat or drink anything else during the test. You must remain at the testing location to make sure that your blood is drawn on time. You should also avoid exercising during the test, because exercise can alter test results. How do I prepare for this test? Eat normally for  3 days prior to the GTT test, including having plenty of carbohydrate-rich foods. Do not eat or drink anything except water during the final 12 hours before the test. In addition, your health care provider may ask you to stop taking certain medicines before the test. What do the results mean? It is your responsibility to obtain your test results. Ask the lab or department performing the test when and how you will get your results. Contact your health care provider to discuss any questions you have about your results. Range of Normal Values Ranges for normal values may vary among different labs and hospitals. You should always check with your health care provider after having lab work or other tests done to discuss whether your values are considered within normal limits. Normal levels of blood glucose are as follows:  Fasting: less than 105 mg/dL.  1 hour after drinking the solution: less than 190 mg/dL.  2 hours after drinking the solution: less than 165 mg/dL.  3 hours after drinking the solution: less than 145 mg/dL.  Some substances can interfere with GTT results. These may include:  Blood pressure and heart failure medicines, including beta blockers, furosemide, and thiazides.  Anti-inflammatory medicines, including aspirin.  Nicotine.  Some psychiatric  medicines.  Meaning of Results Outside Normal Value Ranges GTT test results that are above normal values may indicate a number of health problems, such as:  Gestational diabetes.  Acute stress response.  Cushing syndrome.  Tumors such as pheochromocytoma or glucagonoma.  Long-term kidney problems.  Pancreatitis.  Hyperthyroidism.  Current infection.  Discuss your test results with your health care provider. He or she will use the results to make a diagnosis and determine a treatment plan that is right for you. This information is not intended to replace advice given to you by your health care provider. Make sure you discuss any questions you have with your health care provider. Document Released: 12/16/2011 Document Revised: 11/22/2015 Document Reviewed: 10/21/2013 Elsevier Interactive Patient Education  2018 [employer]  

## 2018-06-03 ENCOUNTER — Ambulatory Visit (HOSPITAL_COMMUNITY)
Admission: RE | Admit: 2018-06-03 | Discharge: 2018-06-03 | Disposition: A | Discharge status: Home / Self Care | Payer: Medicaid Other | Source: Ambulatory Visit | Attending: Advanced Practice Midwife | Admitting: Advanced Practice Midwife

## 2018-06-03 DIAGNOSIS — Z3A24 24 weeks gestation of pregnancy: Secondary | ICD-10-CM | POA: Diagnosis not present

## 2018-06-03 DIAGNOSIS — Z362 Encounter for other antenatal screening follow-up: Secondary | ICD-10-CM | POA: Insufficient documentation

## 2018-06-03 DIAGNOSIS — O99212 Obesity complicating pregnancy, second trimester: Secondary | ICD-10-CM | POA: Diagnosis not present

## 2018-06-04 ENCOUNTER — Other Ambulatory Visit (HOSPITAL_COMMUNITY): Payer: Self-pay | Admitting: *Deleted

## 2018-06-04 DIAGNOSIS — Z362 Encounter for other antenatal screening follow-up: Secondary | ICD-10-CM

## 2018-06-20 ENCOUNTER — Inpatient Hospital Stay (HOSPITAL_COMMUNITY)
Admission: AD | Admit: 2018-06-20 | Discharge: 2018-06-20 | Disposition: A | Discharge status: Home / Self Care | Payer: Medicaid Other | Source: Ambulatory Visit | Attending: Obstetrics & Gynecology | Admitting: Obstetrics & Gynecology

## 2018-06-20 DIAGNOSIS — O99513 Diseases of the respiratory system complicating pregnancy, third trimester: Secondary | ICD-10-CM | POA: Diagnosis not present

## 2018-06-20 DIAGNOSIS — J101 Influenza due to other identified influenza virus with other respiratory manifestations: Secondary | ICD-10-CM

## 2018-06-20 DIAGNOSIS — Z3A28 28 weeks gestation of pregnancy: Secondary | ICD-10-CM

## 2018-06-20 DIAGNOSIS — R05 Cough: Secondary | ICD-10-CM | POA: Diagnosis present

## 2018-06-20 DIAGNOSIS — O26893 Other specified pregnancy related conditions, third trimester: Secondary | ICD-10-CM | POA: Diagnosis not present

## 2018-06-20 LAB — INFLUENZA PANEL BY PCR (TYPE A & B)
Influenza A By PCR: NEGATIVE
Influenza B By PCR: POSITIVE — AB

## 2018-06-20 LAB — GROUP A STREP BY PCR: Group A Strep by PCR: NOT DETECTED

## 2018-06-20 MED ORDER — OSELTAMIVIR PHOSPHATE 75 MG PO CAPS: 75.0000 mg | ORAL_CAPSULE | Freq: Two times a day (BID) | ORAL | 0 refills | Status: AC

## 2018-06-20 NOTE — Discharge Instructions (Signed)

## 2018-06-20 NOTE — MAU Note (Signed)
Coughing since Thursday and started to get worse on Saturday  Having occasional chills  Having some body aches that started today  Has a sore throat  Feels congested

## 2018-06-20 NOTE — MAU Note (Signed)
Provider Arbie CookeyLisa Leftwhich-Kirby stated pt does not need a NST.

## 2018-06-20 NOTE — MAU Provider Note (Signed)
Chief Complaint: Cough; Nasal Congestion; and Sore Throat   None     SUBJECTIVE HPI: Veronica Hensley is a 27 y.o. G1P0 at 1977w3d by LMP who presents to maternity admissions reporting cough, nasal congestion, and sore throat x 24 hours. She reports some chills and body aches associated with her symptoms. She has mild shortness of breath.  She denies any known exposure to illness.  She has not tried any treatments. There are no other symptoms. She is feeling normal fetal movement.      HPI  No past medical history on file. No past surgical history on file. Social History   Socioeconomic History  . Marital status: Single    Spouse name: Not on file  . Number of children: Not on file  . Years of education: Not on file  . Highest education level: Not on file  Occupational History  . Not on file  Social Needs  . Financial resource strain: Not on file  . Food insecurity:    Worry: Not on file    Inability: Not on file  . Transportation needs:    Medical: Not on file    Non-medical: Not on file  Tobacco Use  . Smoking status: Never Smoker  . Smokeless tobacco: Never Used  Substance and Sexual Activity  . Alcohol use: Not Currently    Comment: occ  . Drug use: No  . Sexual activity: Not on file  Lifestyle  . Physical activity:    Days per week: Not on file    Minutes per session: Not on file  . Stress: Not on file  Relationships  . Social connections:    Talks on phone: Not on file    Gets together: Not on file    Attends religious service: Not on file    Active member of club or organization: Not on file    Attends meetings of clubs or organizations: Not on file    Relationship status: Not on file  . Intimate partner violence:    Fear of current or ex partner: Not on file    Emotionally abused: Not on file    Physically abused: Not on file    Forced sexual activity: Not on file  Other Topics Concern  . Not on file  Social History Narrative  . Not on file   No current  facility-administered medications on file prior to encounter.    Current Outpatient Medications on File Prior to Encounter  Medication Sig Dispense Refill  . Elastic Bandages & Supports (COMFORT FIT MATERNITY SUPP SM) MISC Wear as directed. 1 each 0  . prenatal vitamin w/FE, FA (PRENATAL 1 + 1) 27-1 MG TABS tablet Take 1 tablet by mouth daily at 12 noon.     No Known Allergies  ROS:  Review of Systems  Constitutional: Positive for chills. Negative for fatigue and fever.  HENT: Positive for congestion, rhinorrhea and sore throat.   Eyes: Negative for visual disturbance.  Respiratory: Positive for shortness of breath.   Cardiovascular: Negative for chest pain.  Gastrointestinal: Negative for abdominal pain, nausea and vomiting.  Genitourinary: Negative for difficulty urinating, dysuria, flank pain, pelvic pain, vaginal bleeding, vaginal discharge and vaginal pain.  Neurological: Negative for dizziness and headaches.  Psychiatric/Behavioral: Negative.      I have reviewed patient's Past Medical Hx, Surgical Hx, Family Hx, Social Hx, medications and allergies.   Physical Exam   Patient Vitals for the past 24 hrs:  BP Temp Temp src Pulse Resp  Height Weight  06/20/18 1852 (!) 123/53 99.8 F (37.7 C) Oral (!) 58 18 5\' 6"  (1.676 m) 113.5 kg   O2 100% on RA  Constitutional: Well-developed, well-nourished female in no acute distress.  HEART: normal rate, heart sounds, regular rhythm RESP: normal effort, lung sounds clear and equal bilaterally GI: Abd soft, non-tender. Pos BS x 4 MS: Extremities nontender, no edema, normal ROM Neurologic: Alert and oriented x 4.  GU: Neg CVAT.   FHT 158 by doppler  LAB RESULTS Results for orders placed or performed during the hospital encounter of 06/20/18 (from the past 24 hour(s))  Influenza panel by PCR (type A & B)     Status: Abnormal   Collection Time: 06/20/18  7:00 PM  Result Value Ref Range   Influenza A By PCR NEGATIVE NEGATIVE    Influenza B By PCR POSITIVE (A) NEGATIVE  Group A Strep by PCR     Status: None   Collection Time: 06/20/18  7:00 PM  Result Value Ref Range   Group A Strep by PCR NOT DETECTED NOT DETECTED    A/Positive/-- (10/28 1139)  IMAGING   MAU Management/MDM: Ordered labs and reviewed results.  Pt with mild SOB but lung sounds clear, O2 sats 100% on RA.  Pt is influenza B positive.  Discussed results with pt.  Rx for Tamiflu and reviewed safe OTC cold meds in pregnancy.  Increase PO fluids and rest.  Return to MAU or ED if SOB worsens.  Will change pt prenatal appt tomorrow to a day later this week or next week.  Pt discharged with strict return precautions.  ASSESSMENT 1. Influenza B   2. Pregnancy with 28 completed weeks gestation     PLAN Discharge home Allergies as of 06/20/2018   No Known Allergies     Medication List    STOP taking these medications   cefUROXime 500 MG tablet Commonly known as:  CEFTIN   Doxylamine-Pyridoxine 10-10 MG Tbec Commonly known as:  DICLEGIS     TAKE these medications   COMFORT FIT MATERNITY SUPP SM Misc Wear as directed.   oseltamivir 75 MG capsule Commonly known as:  TAMIFLU Take 1 capsule (75 mg total) by mouth 2 (two) times daily for 5 days.   prenatal vitamin w/FE, FA 27-1 MG Tabs tablet Take 1 tablet by mouth daily at 12 noon.      Follow-up Information    [facility].   Why:  As scheduled, return to MAU as needed for emergencies  Contact information: [address] [place] [phone]          Sharen CounterLisa Leftwich-Kirby Certified Nurse-Midwife 06/21/2018  8:13 AM

## 2018-06-21 ENCOUNTER — Encounter: Payer: Medicaid Other | Admitting: Obstetrics

## 2018-06-21 ENCOUNTER — Telehealth: Payer: Self-pay | Admitting: Obstetrics

## 2018-06-21 ENCOUNTER — Other Ambulatory Visit: Payer: Medicaid Other

## 2018-06-21 NOTE — Telephone Encounter (Signed)
MAU called stating pt has the flu and is unable to keep appt today for GTT & provider. Given new appt lab only GTT 06/25/18 & appt [facility] 07/05/18.

## 2018-06-21 NOTE — Telephone Encounter (Signed)
Noted  

## 2018-06-24 ENCOUNTER — Ambulatory Visit (HOSPITAL_COMMUNITY): Payer: Medicaid Other

## 2018-06-25 ENCOUNTER — Other Ambulatory Visit: Payer: Medicaid Other

## 2018-06-30 NOTE — L&D Delivery Note (Signed)
Nurse call reports patient complete and with urge to push.  In room to assess. Cat I FT noted.  Patient with good pushing efforts.  Patient delivered as below with provider, FOB, and nurse encouragement.   Delivery Note At 6:04 PM, on September 16, 2018, a viable female was delivered via Vaginal, Spontaneous (Presentation: Occiput Anterior).  APGAR: 8, 9. Shoulders delivered easily and infant with good tone and spontaneous cry. Tactile stimulation given by provider and infant placed on mother's abdomen where nurse continued tactile stimulation. Cord clamped, cut, and blood collected. Placenta delivered spontaneously and noted to be intact with 3VC upon inspection.  Vaginal inspection revealed a left labial laceration that extended into the periclitoral area. Itwas repaired with 3-0 vicryl on SH.  No additional anesthetic necessary and patient tolerated the procedure well. Fundus firm, at the umbilicus, and bleeding small.  Mother hemodynamically stable and infant skin to skin prior to provider exit.  Mother desires Mirena for birth control and opts to breastfeed.  Infant weight at one hour of life: 5lbs 9.8oz, 18.5in.    Anesthesia:  Epidural Episiotomy: None Lacerations: Labial Suture Repair: 3.0 vicryl Est. Blood Loss (mL):  283  Mom to postpartum.  Baby to Couplet care / Skin to Skin.  Cherre Robins MSN, CNM 09/16/2018, 6:46 PM

## 2018-07-05 ENCOUNTER — Ambulatory Visit: Payer: Medicaid Other | Admitting: Obstetrics

## 2018-07-05 ENCOUNTER — Ambulatory Visit (HOSPITAL_COMMUNITY): Payer: Medicaid Other

## 2018-07-05 ENCOUNTER — Ambulatory Visit (HOSPITAL_COMMUNITY)
Admission: RE | Admit: 2018-07-05 | Discharge: 2018-07-05 | Disposition: A | Discharge status: Home / Self Care | Payer: Medicaid Other | Source: Ambulatory Visit | Attending: Obstetrics | Admitting: Obstetrics

## 2018-07-05 ENCOUNTER — Encounter: Payer: Self-pay | Admitting: Obstetrics

## 2018-07-05 DIAGNOSIS — Z362 Encounter for other antenatal screening follow-up: Secondary | ICD-10-CM

## 2018-07-05 DIAGNOSIS — O99213 Obesity complicating pregnancy, third trimester: Secondary | ICD-10-CM | POA: Diagnosis not present

## 2018-07-05 DIAGNOSIS — Z3A29 29 weeks gestation of pregnancy: Secondary | ICD-10-CM

## 2018-07-05 NOTE — Progress Notes (Signed)
Pt is here for follow up, but did not show up for her lab only visit on 12/27 so there was nothing to be done. Advised pt to come back for lab only visit fasting one morning this week.

## 2018-07-05 NOTE — Progress Notes (Signed)
Appointment cancelled because patient not fasted for lab test.  A/P:  [redacted] weeks gestation.  Follow up this week for 2 hour GTT - fasting.  Brock Bad MD 07-05-2018

## 2018-07-08 ENCOUNTER — Other Ambulatory Visit: Payer: Medicaid Other

## 2018-07-08 DIAGNOSIS — Z3490 Encounter for supervision of normal pregnancy, unspecified, unspecified trimester: Secondary | ICD-10-CM

## 2018-07-12 ENCOUNTER — Encounter: Payer: Self-pay | Admitting: Obstetrics

## 2018-07-12 ENCOUNTER — Ambulatory Visit (INDEPENDENT_AMBULATORY_CARE_PROVIDER_SITE_OTHER): Payer: Medicaid Other | Admitting: Obstetrics

## 2018-07-12 VITALS — BP 108/77 | HR 80 | Wt 246.2 lb

## 2018-07-12 DIAGNOSIS — Z87440 Personal history of urinary (tract) infections: Secondary | ICD-10-CM

## 2018-07-12 DIAGNOSIS — Z3493 Encounter for supervision of normal pregnancy, unspecified, third trimester: Secondary | ICD-10-CM

## 2018-07-12 DIAGNOSIS — Z3490 Encounter for supervision of normal pregnancy, unspecified, unspecified trimester: Secondary | ICD-10-CM

## 2018-07-12 DIAGNOSIS — Z3A31 31 weeks gestation of pregnancy: Secondary | ICD-10-CM

## 2018-07-12 MED ORDER — VITAFOL ULTRA 29-0.6-0.4-200 MG PO CAPS: 1.0000 | ORAL_CAPSULE | Freq: Every day | ORAL | 4 refills | Status: DC

## 2018-07-12 NOTE — Progress Notes (Signed)
Pt presents for ROB needs rf on PNV's.

## 2018-07-12 NOTE — Progress Notes (Signed)
Subjective:  Veronica Hensley is a 28 y.o. G1P0 at [redacted]w[redacted]d being seen today for ongoing prenatal care.  She is currently monitored for the following issues for this low-risk pregnancy and has Supervision of normal pregnancy; Biological false positive RPR test; and UTI due to Klebsiella species on their problem list.  Patient reports no complaints.  Contractions: Irregular. Vag. Bleeding: None.  Movement: Present. Denies leaking of fluid.   The following portions of the patient's history were reviewed and updated as appropriate: allergies, current medications, past family history, past medical history, past social history, past surgical history and problem list. Problem list updated.  Objective:   Vitals:   07/12/18 1500  Weight: 246 lb 3.2 oz (111.7 kg)    Fetal Status:     Movement: Present     General:  Alert, oriented and cooperative. Patient is in no acute distress.  Skin: Skin is warm and dry. No rash noted.   Cardiovascular: Normal heart rate noted  Respiratory: Normal respiratory effort, no problems with respiration noted  Abdomen: Soft, gravid, appropriate for gestational age. Pain/Pressure: Present     Pelvic:  Cervical exam deferred        Extremities: Normal range of motion.  Edema: None  Mental Status: Normal mood and affect. Normal behavior. Normal judgment and thought content.   Urinalysis:      Assessment and Plan:  Pregnancy: G1P0 at [redacted]w[redacted]d  1. Encounter for supervision of normal pregnancy, antepartum, unspecified gravidity Rx: - Prenat-Fe Poly-Methfol-FA-DHA (VITAFOL ULTRA) 29-0.6-0.4-200 MG CAPS; Take 1 capsule by mouth daily before breakfast.  Dispense: 90 capsule; Refill: 4  2. History of UTI - Klebsiella.  Treated. Rx: - Urine Culture for TOC  Preterm labor symptoms and general obstetric precautions including but not limited to vaginal bleeding, contractions, leaking of fluid and fetal movement were reviewed in detail with the patient. Please refer to After Visit  Summary for other counseling recommendations.  Return in about 2 weeks (around 07/26/2018) for ROB.   Brock Bad, MD

## 2018-07-13 LAB — HIV ANTIBODY (ROUTINE TESTING W REFLEX): HIV Screen 4th Generation wRfx: NONREACTIVE

## 2018-07-13 LAB — CBC
Hematocrit: 36.9 % (ref 34.0–46.6)
Hemoglobin: 12.1 g/dL (ref 11.1–15.9)
MCH: 25.5 pg — ABNORMAL LOW (ref 26.6–33.0)
MCHC: 32.8 g/dL (ref 31.5–35.7)
MCV: 78 fL — ABNORMAL LOW (ref 79–97)
Platelets: 292 10*3/uL (ref 150–450)
RBC: 4.75 x10E6/uL (ref 3.77–5.28)
RDW: 13.8 % (ref 11.7–15.4)
WBC: 9.9 10*3/uL (ref 3.4–10.8)

## 2018-07-13 LAB — GLUCOSE TOLERANCE, 2 HOURS W/ 1HR
Glucose, 1 hour: 107 mg/dL (ref 65–179)
Glucose, 2 hour: 95 mg/dL (ref 65–152)
Glucose, Fasting: 70 mg/dL (ref 65–91)

## 2018-07-13 LAB — RPR, QUANT+TP ABS (REFLEX): T Pallidum Abs: NONREACTIVE

## 2018-07-13 LAB — RPR: RPR Ser Ql: REACTIVE — AB

## 2018-07-14 LAB — URINE CULTURE

## 2018-07-15 ENCOUNTER — Other Ambulatory Visit: Payer: Self-pay | Admitting: Obstetrics

## 2018-07-15 DIAGNOSIS — N3000 Acute cystitis without hematuria: Secondary | ICD-10-CM

## 2018-07-15 MED ORDER — AMOXICILLIN-POT CLAVULANATE 875-125 MG PO TABS: 1.0000 | ORAL_TABLET | Freq: Two times a day (BID) | ORAL | 0 refills | Status: DC

## 2018-07-27 ENCOUNTER — Encounter: Payer: Medicaid Other | Admitting: Obstetrics

## 2018-08-03 ENCOUNTER — Other Ambulatory Visit: Payer: Self-pay

## 2018-08-03 ENCOUNTER — Ambulatory Visit (INDEPENDENT_AMBULATORY_CARE_PROVIDER_SITE_OTHER): Payer: Medicaid Other | Admitting: Obstetrics

## 2018-08-03 ENCOUNTER — Encounter: Payer: Self-pay | Admitting: Obstetrics

## 2018-08-03 VITALS — BP 126/75 | HR 47 | Wt 253.0 lb

## 2018-08-03 DIAGNOSIS — Z3493 Encounter for supervision of normal pregnancy, unspecified, third trimester: Secondary | ICD-10-CM

## 2018-08-03 DIAGNOSIS — M549 Dorsalgia, unspecified: Secondary | ICD-10-CM

## 2018-08-03 DIAGNOSIS — Z3490 Encounter for supervision of normal pregnancy, unspecified, unspecified trimester: Secondary | ICD-10-CM

## 2018-08-03 MED ORDER — COMFORT FIT MATERNITY SUPP SM MISC: 0 refills | Status: DC

## 2018-08-03 NOTE — Progress Notes (Signed)
Subjective:  Veronica Hensley is a 28 y.o. G1P0 at [redacted]w[redacted]d being seen today for ongoing prenatal care.  She is currently monitored for the following issues for this low-risk pregnancy and has Supervision of normal pregnancy; Biological false positive RPR test; and UTI due to Klebsiella species on their problem list.  Patient reports backache.  Contractions: Not present. Vag. Bleeding: None.  Movement: Present. Denies leaking of fluid.   The following portions of the patient's history were reviewed and updated as appropriate: allergies, current medications, past family history, past medical history, past social history, past surgical history and problem list. Problem list updated.  Objective:   Vitals:   08/03/18 1531  BP: 126/75  Pulse: (!) 47  Weight: 253 lb (114.8 kg)    Fetal Status: Fetal Heart Rate (bpm): 150   Movement: Present     General:  Alert, oriented and cooperative. Patient is in no acute distress.  Skin: Skin is warm and dry. No rash noted.   Cardiovascular: Normal heart rate noted  Respiratory: Normal respiratory effort, no problems with respiration noted  Abdomen: Soft, gravid, appropriate for gestational age. Pain/Pressure: Absent     Pelvic:  Cervical exam deferred        Extremities: Normal range of motion.  Edema: None  Mental Status: Normal mood and affect. Normal behavior. Normal judgment and thought content.   Urinalysis:      Assessment and Plan:  Pregnancy: G1P0 at [redacted]w[redacted]d  1. Encounter for supervision of normal pregnancy, antepartum, unspecified gravidity  2. Backache symptom Rx: - Elastic Bandages & Supports (COMFORT FIT MATERNITY SUPP SM) MISC; Wear as directed.  Dispense: 1 each; Refill: 0   Preterm labor symptoms and general obstetric precautions including but not limited to vaginal bleeding, contractions, leaking of fluid and fetal movement were reviewed in detail with the patient. Please refer to After Visit Summary for other counseling recommendations.   Return in about 2 weeks (around 08/17/2018) for ROB.   Brock Bad, MD

## 2018-08-16 ENCOUNTER — Ambulatory Visit (INDEPENDENT_AMBULATORY_CARE_PROVIDER_SITE_OTHER): Payer: Medicaid Other | Admitting: Obstetrics

## 2018-08-16 ENCOUNTER — Encounter: Payer: Self-pay | Admitting: Obstetrics

## 2018-08-16 ENCOUNTER — Other Ambulatory Visit (HOSPITAL_COMMUNITY)
Admission: RE | Admit: 2018-08-16 | Discharge: 2018-08-16 | Disposition: A | Discharge status: Home / Self Care | Payer: Medicaid Other | Source: Ambulatory Visit | Attending: Obstetrics | Admitting: Obstetrics

## 2018-08-16 ENCOUNTER — Encounter: Payer: Medicaid Other | Admitting: Obstetrics

## 2018-08-16 VITALS — BP 123/77 | HR 54 | Wt 256.7 lb

## 2018-08-16 DIAGNOSIS — Z3490 Encounter for supervision of normal pregnancy, unspecified, unspecified trimester: Secondary | ICD-10-CM | POA: Diagnosis present

## 2018-08-16 DIAGNOSIS — Z3A36 36 weeks gestation of pregnancy: Secondary | ICD-10-CM

## 2018-08-16 DIAGNOSIS — Z3493 Encounter for supervision of normal pregnancy, unspecified, third trimester: Secondary | ICD-10-CM

## 2018-08-16 DIAGNOSIS — M549 Dorsalgia, unspecified: Secondary | ICD-10-CM

## 2018-08-16 LAB — OB RESULTS CONSOLE GBS
GBS: NEGATIVE
GBS: NEGATIVE

## 2018-08-16 NOTE — Progress Notes (Signed)
Subjective:  Veronica Hensley is a 28 y.o. G1P0 at [redacted]w[redacted]d being seen today for ongoing prenatal care.  She is currently monitored for the following issues for this low-risk pregnancy and has Supervision of normal pregnancy; Biological false positive RPR test; and UTI due to Klebsiella species on their problem list.  Patient reports backache.  Contractions: Not present. Vag. Bleeding: None.  Movement: Present. Denies leaking of fluid.   The following portions of the patient's history were reviewed and updated as appropriate: allergies, current medications, past family history, past medical history, past social history, past surgical history and problem list. Problem list updated.  Objective:   Vitals:   08/16/18 1055  BP: 123/77  Pulse: (!) 54  Weight: 256 lb 10.6 oz (116.4 kg)    Fetal Status:     Movement: Present     General:  Alert, oriented and cooperative. Patient is in no acute distress.  Skin: Skin is warm and dry. No rash noted.   Cardiovascular: Normal heart rate noted  Respiratory: Normal respiratory effort, no problems with respiration noted  Abdomen: Soft, gravid, appropriate for gestational age. Pain/Pressure: Absent     Pelvic:  Cervical exam deferred        Extremities: Normal range of motion.  Edema: None  Mental Status: Normal mood and affect. Normal behavior. Normal judgment and thought content.   Urinalysis:      Assessment and Plan:  Pregnancy: G1P0 at [redacted]w[redacted]d  1. Encounter for supervision of normal pregnancy, antepartum, unspecified gravidity Rx: - Culture, beta strep (group b only) - Cervicovaginal ancillary only( [facility])  2. Backache symptom - Maternity Belt Rx  Preterm labor symptoms and general obstetric precautions including but not limited to vaginal bleeding, contractions, leaking of fluid and fetal movement were reviewed in detail with the patient. Please refer to After Visit Summary for other counseling recommendations.  Return in about 1 week (around  08/23/2018) for ROB.   Brock Bad, MD

## 2018-08-17 LAB — CERVICOVAGINAL ANCILLARY ONLY
Bacterial vaginitis: POSITIVE — AB
CANDIDA VAGINITIS: NEGATIVE
CHLAMYDIA, DNA PROBE: NEGATIVE
Neisseria Gonorrhea: NEGATIVE
Trichomonas: NEGATIVE

## 2018-08-18 ENCOUNTER — Other Ambulatory Visit: Payer: Self-pay | Admitting: Obstetrics

## 2018-08-18 DIAGNOSIS — N760 Acute vaginitis: Principal | ICD-10-CM

## 2018-08-18 DIAGNOSIS — B9689 Other specified bacterial agents as the cause of diseases classified elsewhere: Secondary | ICD-10-CM

## 2018-08-18 MED ORDER — TINIDAZOLE 500 MG PO TABS: 1000.0000 mg | ORAL_TABLET | Freq: Every day | ORAL | 2 refills | Status: DC

## 2018-08-20 LAB — CULTURE, BETA STREP (GROUP B ONLY): Strep Gp B Culture: NEGATIVE

## 2018-08-23 ENCOUNTER — Ambulatory Visit (INDEPENDENT_AMBULATORY_CARE_PROVIDER_SITE_OTHER): Payer: Medicaid Other | Admitting: Obstetrics

## 2018-08-23 ENCOUNTER — Encounter: Payer: Self-pay | Admitting: Obstetrics

## 2018-08-23 DIAGNOSIS — Z3A37 37 weeks gestation of pregnancy: Secondary | ICD-10-CM

## 2018-08-23 DIAGNOSIS — Z3493 Encounter for supervision of normal pregnancy, unspecified, third trimester: Secondary | ICD-10-CM

## 2018-08-23 DIAGNOSIS — Z3490 Encounter for supervision of normal pregnancy, unspecified, unspecified trimester: Secondary | ICD-10-CM

## 2018-08-23 NOTE — Progress Notes (Signed)
Patient reports fetal movement, denies pain. 

## 2018-08-23 NOTE — Progress Notes (Signed)
Subjective:  Veronica Hensley is a 28 y.o. G1P0 at [redacted]w[redacted]d being seen today for ongoing prenatal care.  She is currently monitored for the following issues for this low-risk pregnancy and has Supervision of normal pregnancy; Biological false positive RPR test; and UTI due to Klebsiella species on their problem list.  Patient reports no complaints.  Contractions: Not present. Vag. Bleeding: None.  Movement: Present. Denies leaking of fluid.   The following portions of the patient's history were reviewed and updated as appropriate: allergies, current medications, past family history, past medical history, past social history, past surgical history and problem list. Problem list updated.  Objective:   Vitals:   08/23/18 1508  BP: 113/67  Pulse: (!) 48  Weight: 257 lb 14.4 oz (117 kg)    Fetal Status: Fetal Heart Rate (bpm): 150   Movement: Present     General:  Alert, oriented and cooperative. Patient is in no acute distress.  Skin: Skin is warm and dry. No rash noted.   Cardiovascular: Normal heart rate noted  Respiratory: Normal respiratory effort, no problems with respiration noted  Abdomen: Soft, gravid, appropriate for gestational age. Pain/Pressure: Absent     Pelvic:  Cervical exam deferred        Extremities: Normal range of motion.  Edema: None  Mental Status: Normal mood and affect. Normal behavior. Normal judgment and thought content.   Urinalysis:      Assessment and Plan:  Pregnancy: G1P0 at [redacted]w[redacted]d  1. Encounter for supervision of normal pregnancy, antepartum, unspecified gravidity   Term labor symptoms and general obstetric precautions including but not limited to vaginal bleeding, contractions, leaking of fluid and fetal movement were reviewed in detail with the patient. Please refer to After Visit Summary for other counseling recommendations.  Return in about 1 week (around 08/30/2018) for ROB.   Brock Bad, MD

## 2018-08-24 ENCOUNTER — Encounter: Payer: Medicaid Other | Admitting: Obstetrics

## 2018-08-30 ENCOUNTER — Encounter: Payer: Self-pay | Admitting: Obstetrics

## 2018-08-30 ENCOUNTER — Ambulatory Visit (INDEPENDENT_AMBULATORY_CARE_PROVIDER_SITE_OTHER): Payer: Medicaid Other | Admitting: Obstetrics

## 2018-08-30 VITALS — BP 125/74 | HR 50 | Wt 261.7 lb

## 2018-08-30 DIAGNOSIS — Z3490 Encounter for supervision of normal pregnancy, unspecified, unspecified trimester: Secondary | ICD-10-CM

## 2018-08-30 DIAGNOSIS — Z3A38 38 weeks gestation of pregnancy: Secondary | ICD-10-CM

## 2018-08-30 DIAGNOSIS — Z3493 Encounter for supervision of normal pregnancy, unspecified, third trimester: Secondary | ICD-10-CM

## 2018-08-30 NOTE — Progress Notes (Signed)
Subjective:  Veronica Hensley is a 28 y.o. G1P0 at [redacted]w[redacted]d being seen today for ongoing prenatal care.  She is currently monitored for the following issues for this low-risk pregnancy and has Supervision of normal pregnancy; Biological false positive RPR test; and UTI due to Klebsiella species on their problem list.  Patient reports no complaints.  Contractions: Not present. Vag. Bleeding: None.  Movement: Present. Denies leaking of fluid.   The following portions of the patient's history were reviewed and updated as appropriate: allergies, current medications, past family history, past medical history, past social history, past surgical history and problem list. Problem list updated.  Objective:   Vitals:   08/30/18 1107  BP: 125/74  Pulse: (!) 50  Weight: 261 lb 11.2 oz (118.7 kg)    Fetal Status:     Movement: Present     General:  Alert, oriented and cooperative. Patient is in no acute distress.  Skin: Skin is warm and dry. No rash noted.   Cardiovascular: Normal heart rate noted  Respiratory: Normal respiratory effort, no problems with respiration noted  Abdomen: Soft, gravid, appropriate for gestational age. Pain/Pressure: Absent     Pelvic:  Cervical exam deferred        Extremities: Normal range of motion.  Edema: None  Mental Status: Normal mood and affect. Normal behavior. Normal judgment and thought content.   Urinalysis:      Assessment and Plan:  Pregnancy: G1P0 at [redacted]w[redacted]d  1. Encounter for supervision of normal pregnancy, antepartum, unspecified gravidity   Term labor symptoms and general obstetric precautions including but not limited to vaginal bleeding, contractions, leaking of fluid and fetal movement were reviewed in detail with the patient. Please refer to After Visit Summary for other counseling recommendations.  Return in about 1 week (around 09/06/2018) for ROB.   Brock Bad, MD

## 2018-09-06 ENCOUNTER — Encounter: Payer: Self-pay | Admitting: Obstetrics

## 2018-09-06 ENCOUNTER — Ambulatory Visit (INDEPENDENT_AMBULATORY_CARE_PROVIDER_SITE_OTHER): Payer: Medicaid Other | Admitting: Obstetrics

## 2018-09-06 DIAGNOSIS — Z3493 Encounter for supervision of normal pregnancy, unspecified, third trimester: Secondary | ICD-10-CM

## 2018-09-06 DIAGNOSIS — Z3490 Encounter for supervision of normal pregnancy, unspecified, unspecified trimester: Secondary | ICD-10-CM

## 2018-09-06 DIAGNOSIS — Z3A39 39 weeks gestation of pregnancy: Secondary | ICD-10-CM

## 2018-09-06 NOTE — Progress Notes (Signed)
Subjective:  Veronica Hensley is a 28 y.o. G1P0 at [redacted]w[redacted]d being seen today for ongoing prenatal care.  She is currently monitored for the following issues for this low-risk pregnancy and has Supervision of normal pregnancy; Biological false positive RPR test; and UTI due to Klebsiella species on their problem list.  Patient reports no complaints.  Contractions: Not present. Vag. Bleeding: None.  Movement: Present. Denies leaking of fluid.   The following portions of the patient's history were reviewed and updated as appropriate: allergies, current medications, past family history, past medical history, past social history, past surgical history and problem list. Problem list updated.  Objective:   Vitals:   09/06/18 1118  BP: 140/84  Pulse: (!) 48  Weight: 262 lb 9.6 oz (119.1 kg)    Fetal Status:     Movement: Present     General:  Alert, oriented and cooperative. Patient is in no acute distress.  Skin: Skin is warm and dry. No rash noted.   Cardiovascular: Normal heart rate noted  Respiratory: Normal respiratory effort, no problems with respiration noted  Abdomen: Soft, gravid, appropriate for gestational age. Pain/Pressure: Absent     Pelvic:  Cervical exam performed      Cvx: 1-2 cm / 70% / -2 / Vtx  Extremities: Normal range of motion.  Edema: None  Mental Status: Normal mood and affect. Normal behavior. Normal judgment and thought content.   Urinalysis:      Assessment and Plan:  Pregnancy: G1P0 at [redacted]w[redacted]d  1. Encounter for supervision of normal pregnancy, antepartum, unspecified gravidity  Term labor symptoms and general obstetric precautions including but not limited to vaginal bleeding, contractions, leaking of fluid and fetal movement were reviewed in detail with the patient. Please refer to After Visit Summary for other counseling recommendations.  Return in about 1 week (around 09/13/2018) for ROB.   Brock Bad, MD

## 2018-09-06 NOTE — Progress Notes (Signed)
Pt presents for ROB without complaints.  

## 2018-09-07 ENCOUNTER — Encounter (HOSPITAL_COMMUNITY): Payer: Self-pay | Admitting: *Deleted

## 2018-09-07 ENCOUNTER — Telehealth (HOSPITAL_COMMUNITY): Payer: Self-pay | Admitting: *Deleted

## 2018-09-07 NOTE — Telephone Encounter (Signed)
Preadmission screen  

## 2018-09-13 ENCOUNTER — Other Ambulatory Visit: Payer: Self-pay

## 2018-09-13 ENCOUNTER — Ambulatory Visit (INDEPENDENT_AMBULATORY_CARE_PROVIDER_SITE_OTHER): Payer: Medicaid Other | Admitting: Obstetrics

## 2018-09-13 ENCOUNTER — Encounter: Payer: Self-pay | Admitting: Obstetrics

## 2018-09-13 VITALS — BP 126/70 | HR 47 | Wt 266.8 lb

## 2018-09-13 DIAGNOSIS — Z23 Encounter for immunization: Secondary | ICD-10-CM | POA: Diagnosis not present

## 2018-09-13 DIAGNOSIS — Z3490 Encounter for supervision of normal pregnancy, unspecified, unspecified trimester: Secondary | ICD-10-CM

## 2018-09-13 DIAGNOSIS — O480 Post-term pregnancy: Secondary | ICD-10-CM

## 2018-09-13 NOTE — Progress Notes (Signed)
Subjective:  Veronica Hensley is a 28 y.o. G1P0 at [redacted]w[redacted]d being seen today for ongoing prenatal care.  She is currently monitored for the following issues for this low-risk pregnancy and has Supervision of normal pregnancy; Biological false positive RPR test; and UTI due to Klebsiella species on their problem list.  Patient reports no complaints.  Contractions: Not present. Vag. Bleeding: None.  Movement: Present. Denies leaking of fluid.   The following portions of the patient's history were reviewed and updated as appropriate: allergies, current medications, past family history, past medical history, past social history, past surgical history and problem list. Problem list updated.  Objective:   Vitals:   09/13/18 1149  BP: 126/70  Pulse: (!) 47  Weight: 266 lb 12.8 oz (121 kg)    Fetal Status:     Movement: Present     General:  Alert, oriented and cooperative. Patient is in no acute distress.  Skin: Skin is warm and dry. No rash noted.   Cardiovascular: Normal heart rate noted  Respiratory: Normal respiratory effort, no problems with respiration noted  Abdomen: Soft, gravid, appropriate for gestational age. Pain/Pressure: Present     Pelvic:  Cervical exam deferred        Extremities: Normal range of motion.  Edema: None  Mental Status: Normal mood and affect. Normal behavior. Normal judgment and thought content.   Urinalysis:      Assessment and Plan:  Pregnancy: G1P0 at [redacted]w[redacted]d  1. Encounter for supervision of normal pregnancy, antepartum, unspecified gravidity Rx: - Tdap vaccine greater than or equal to 7yo IM  2. Post-term pregnancy, 40-42 weeks of gestation Rx: - Fetal non-stress test:  Reactive.  Good variability with 15x15 accels and no decels.  3. Need for immunization against influenza Rx: - Flu Vaccine QUAD 36+ mos IM  Term labor symptoms and general obstetric precautions including but not limited to vaginal bleeding, contractions, leaking of fluid and fetal movement  were reviewed in detail with the patient. Please refer to After Visit Summary for other counseling recommendations.  Return in about 4 weeks (around 10/11/2018) for POSTPARTUM CARE.   Brock Bad, MD

## 2018-09-13 NOTE — Patient Instructions (Signed)
Tdap Vaccine (Tetanus, Diphtheria and Pertussis): What You Need to Know  1. Why get vaccinated? Tetanus, diphtheria and pertussis are very serious diseases. Tdap vaccine can protect [place] from these diseases. And, Tdap vaccine given to pregnant women can protect newborn babies against pertussis.Marland Kitchen TETANUS (Lockjaw) is rare in the [place] today. It causes painful muscle tightening and stiffness, usually all over the body.  It can lead to tightening of muscles in the head and neck so you can't open your mouth, swallow, or sometimes even breathe. Tetanus kills about 1 out of 10 people who are infected even after receiving the best medical care. DIPHTHERIA is also rare in the [place] today. It can cause a thick coating to form in the back of the throat.  It can lead to breathing problems, heart failure, paralysis, and death. PERTUSSIS (Whooping Cough) causes severe coughing spells, which can cause difficulty breathing, vomiting and disturbed sleep.  It can also lead to weight loss, incontinence, and rib fractures. Up to 2 in 100 adolescents and 5 in 100 adults with pertussis are hospitalized or have complications, which could include pneumonia or death. These diseases are caused by bacteria. Diphtheria and pertussis are spread from person to person through secretions from coughing or sneezing. Tetanus enters the body through cuts, scratches, or wounds. Before vaccines, as many as 200,000 cases of diphtheria, 200,000 cases of pertussis, and hundreds of cases of tetanus, were reported in the [place] each year. Since vaccination began, reports of cases for tetanus and diphtheria have dropped by about 99% and for pertussis by about 80%. 2. Tdap vaccine Tdap vaccine can protect adolescents and adults from tetanus, diphtheria, and pertussis. One dose of Tdap is routinely given at age 67 or 10. People who did not get Tdap at that age should get it as soon as possible. Tdap is especially important  for healthcare professionals and anyone having close contact with a baby younger than 12 months. Pregnant women should get a dose of Tdap during every pregnancy, to protect the newborn from pertussis. Infants are most at risk for severe, life-threatening complications from pertussis. Another vaccine, called Td, protects against tetanus and diphtheria, but not pertussis. A Td booster should be given every 10 years. Tdap may be given as one of these boosters if you have never gotten Tdap before. Tdap may also be given after a severe cut or burn to prevent tetanus infection. Your doctor or the person giving you the vaccine can give you more information. Tdap may safely be given at the same time as other vaccines. 3. Some people should not get this vaccine  A person who has ever had a life-threatening allergic reaction after a previous dose of any diphtheria, tetanus or pertussis containing vaccine, OR has a severe allergy to any part of this vaccine, should not get Tdap vaccine. Tell the person giving the vaccine about any severe allergies.  Anyone who had coma or long repeated seizures within 7 days after a childhood dose of DTP or DTaP, or a previous dose of Tdap, should not get Tdap, unless a cause other than the vaccine was found. They can still get Td.  Talk to your doctor if you: ? have seizures or another nervous system problem, ? had severe pain or swelling after any vaccine containing diphtheria, tetanus or pertussis, ? ever had a condition called Guillain-Barr Syndrome (GBS), ? aren't feeling well on the day the shot is scheduled. 4. Risks With any medicine, including vaccines, there  is a chance of side effects. These are usually mild and go away on their own. Serious reactions are also possible but are rare. Most people who get Tdap vaccine do not have any problems with it. Mild problems following Tdap (Did not interfere with activities)  Pain where the shot was given (about 3 in 4  adolescents or 2 in 3 adults)  Redness or swelling where the shot was given (about 1 person in 5)  Mild fever of at least 100.59F (up to about 1 in 25 adolescents or 1 in 100 adults)  Headache (about 3 or 4 people in 10)  Tiredness (about 1 person in 3 or 4)  Nausea, vomiting, diarrhea, stomach ache (up to 1 in 4 adolescents or 1 in 10 adults)  Chills, sore joints (about 1 person in 10)  Body aches (about 1 person in 3 or 4)  Rash, swollen glands (uncommon) Moderate problems following Tdap (Interfered with activities, but did not require medical attention)  Pain where the shot was given (up to 1 in 5 or 6)  Redness or swelling where the shot was given (up to about 1 in 16 adolescents or 1 in 12 adults)  Fever over 102F (about 1 in 100 adolescents or 1 in 250 adults)  Headache (about 1 in 7 adolescents or 1 in 10 adults)  Nausea, vomiting, diarrhea, stomach ache (up to 1 or 3 people in 100)  Swelling of the entire arm where the shot was given (up to about 1 in 500). Severe problems following Tdap (Unable to perform usual activities; required medical attention)  Swelling, severe pain, bleeding and redness in the arm where the shot was given (rare). Problems that could happen after any vaccine:  People sometimes faint after a medical procedure, including vaccination. Sitting or lying down for about 15 minutes can help prevent fainting, and injuries caused by a fall. Tell your doctor if you feel dizzy, or have vision changes or ringing in the ears.  Some people get severe pain in the shoulder and have difficulty moving the arm where a shot was given. This happens very rarely.  Any medication can cause a severe allergic reaction. Such reactions from a vaccine are very rare, estimated at fewer than 1 in a million doses, and would happen within a few minutes to a few hours after the vaccination. As with any medicine, there is a very remote chance of a vaccine causing a serious  injury or death. The safety of vaccines is always being monitored. For more information, visit: http://www.aguilar.org/ 5. What if there is a serious problem? What should I look for?  Look for anything that concerns you, such as signs of a severe allergic reaction, very high fever, or unusual behavior. Signs of a severe allergic reaction can include hives, swelling of the face and throat, difficulty breathing, a fast heartbeat, dizziness, and weakness. These would usually start a few minutes to a few hours after the vaccination. What should I do?  If you think it is a severe allergic reaction or other emergency that can't wait, call 9-1-1 or get the person to the nearest hospital. Otherwise, call your doctor.  Afterward, the reaction should be reported to the Vaccine Adverse Event Reporting System (VAERS). Your doctor might file this report, or you can do it yourself through the VAERS web site at www.vaers.SamedayNews.es, or by calling 202-183-4281. VAERS does not give medical advice. 6. The National Vaccine Injury Compensation Program The [employer] Vaccine Injury Compensation Program ([place])  call your doctor.  · Afterward, the reaction should be reported to the Vaccine Adverse Event Reporting System (VAERS). Your doctor might file this report, or you can do it yourself through the VAERS web site at www.vaers.hhs.gov, or by calling 1-800-822-7967.  VAERS does not give medical advice.  6. The National Vaccine Injury Compensation Program  The National Vaccine Injury Compensation Program (VICP) is a federal program that was created to compensate people who may have been injured by certain vaccines.  Persons who believe they may have been injured by a vaccine can learn about the program and about filing a claim by calling 1-800-338-2382 or visiting the VICP website at www.hrsa.gov/vaccinecompensation. There is a time limit to file a claim for compensation.  7. How can I learn more?  · Ask your doctor. He or she can give you the vaccine package insert or suggest other sources of information.  · Call your local or state health department.  · Contact the Centers for Disease Control and Prevention (CDC):  ? Call 1-800-232-4636 (1-800-CDC-INFO) or  ? Visit CDC's website at www.cdc.gov/vaccines  Vaccine Information Statement Tdap Vaccine (08/23/2013)  This information is not intended to replace advice given to you  by your health care provider. Make sure you discuss any questions you have with your health care provider.  Document Released: 12/16/2011 Document Revised: 02/01/2018 Document Reviewed: 02/01/2018  Elsevier Interactive Patient Education © 2019 Elsevier Inc.  Influenza (Flu) Vaccine (Inactivated or Recombinant): What You Need to Know  1. Why get vaccinated?  Influenza vaccine can prevent influenza (flu).  Flu is a contagious disease that spreads around the [place] every year, usually between October and May. Anyone can get the flu, but it is more dangerous for some people. Infants and young children, people 65 years of age and older, pregnant women, and people with certain health conditions or a weakened immune system are at greatest risk of flu complications.  Pneumonia, bronchitis, sinus infections and ear infections are examples of flu-related complications. If you have a medical condition, such as heart disease, cancer or diabetes, flu can make it worse.  Flu can cause fever and chills, sore throat, muscle aches, fatigue, cough, headache, and runny or stuffy nose. Some people may have vomiting and diarrhea, though this is more common in children than adults.  Each year thousands of people in the [place] die from flu, and many more are hospitalized. Flu vaccine prevents millions of illnesses and flu-related visits to the doctor each year.  2. Influenza vaccine  CDC recommends everyone 6 months of age and older get vaccinated every flu season. Children 6 months through 8 years of age may need 2 doses during a single flu season. Everyone else needs only 1 dose each flu season.  It takes about 2 weeks for protection to develop after vaccination.  There are many flu viruses, and they are always changing. Each year a new flu vaccine is made to protect against three or four viruses that are likely to cause disease in the upcoming flu season. Even when the vaccine doesn't exactly match these viruses, it may  still provide some protection.  Influenza vaccine does not cause flu.  Influenza vaccine may be given at the same time as other vaccines.  3. Talk with your health care provider  Tell your vaccine provider if the person getting the vaccine:  · Has had an allergic reaction after a previous dose of influenza vaccine, or has any severe, life-threatening allergies.  ·   Has ever had Guillain-Barré Syndrome (also called GBS).  In some cases, your health care provider may decide to postpone influenza vaccination to a future visit.  People with minor illnesses, such as a cold, may be vaccinated. People who are moderately or severely ill should usually wait until they recover before getting influenza vaccine.  Your health care provider can give you more information.  4. Risks of a vaccine reaction  · Soreness, redness, and swelling where shot is given, fever, muscle aches, and headache can happen after influenza vaccine.  · There may be a very small increased risk of Guillain-Barré Syndrome (GBS) after inactivated influenza vaccine (the flu shot).  Young children who get the flu shot along with pneumococcal vaccine (PCV13), and/or DTaP vaccine at the same time might be slightly more likely to have a seizure caused by fever. Tell your health care provider if a child who is getting flu vaccine has ever had a seizure.  People sometimes faint after medical procedures, including vaccination. Tell your provider if you feel dizzy or have vision changes or ringing in the ears.  As with any medicine, there is a very remote chance of a vaccine causing a severe allergic reaction, other serious injury, or death.  5. What if there is a serious problem?  An allergic reaction could occur after the vaccinated person leaves the clinic. If you see signs of a severe allergic reaction (hives, swelling of the face and throat, difficulty breathing, a fast heartbeat, dizziness, or weakness), call 9-1-1 and get the person to the nearest  hospital.  For other signs that concern you, call your health care provider.  Adverse reactions should be reported to the Vaccine Adverse Event Reporting System (VAERS). Your health care provider will usually file this report, or you can do it yourself. Visit the VAERS website at www.vaers.hhs.gov or call 1-800-822-7967.VAERS is only for reporting reactions, and VAERS staff do not give medical advice.  6. The National Vaccine Injury Compensation Program  The National Vaccine Injury Compensation Program (VICP) is a federal program that was created to compensate people who may have been injured by certain vaccines. Visit the VICP website at www.hrsa.gov/vaccinecompensation or call 1-800-338-2382 to learn about the program and about filing a claim. There is a time limit to file a claim for compensation.  7. How can I learn more?  · Ask your healthcare provider.  · Call your local or state health department.  · Contact the Centers for Disease Control and Prevention (CDC):  ? Call 1-800-232-4636 (1-800-CDC-INFO) or  ? Visit CDC's www.cdc.gov/flu  Vaccine Information Statement (Interim) Inactivated Influenza Vaccine (02/11/2018)  This information is not intended to replace advice given to you by your health care provider. Make sure you discuss any questions you have with your health care provider.  Document Released: 04/10/2006 Document Revised: 02/15/2018 Document Reviewed: 02/15/2018  Elsevier Interactive Patient Education © 2019 Elsevier Inc.

## 2018-09-14 ENCOUNTER — Other Ambulatory Visit (HOSPITAL_COMMUNITY): Payer: Self-pay | Admitting: Advanced Practice Midwife

## 2018-09-15 ENCOUNTER — Other Ambulatory Visit: Payer: Self-pay | Admitting: Advanced Practice Midwife

## 2018-09-15 ENCOUNTER — Other Ambulatory Visit (HOSPITAL_COMMUNITY): Payer: Self-pay | Admitting: *Deleted

## 2018-09-16 ENCOUNTER — Inpatient Hospital Stay (HOSPITAL_COMMUNITY)
Admission: AD | Admit: 2018-09-16 | Discharge: 2018-09-18 | DRG: 805 | Disposition: A | Discharge status: Home / Self Care | Payer: Medicaid Other | Attending: Obstetrics & Gynecology | Admitting: Obstetrics & Gynecology

## 2018-09-16 ENCOUNTER — Other Ambulatory Visit: Payer: Self-pay

## 2018-09-16 ENCOUNTER — Encounter (HOSPITAL_COMMUNITY): Payer: Self-pay | Admitting: *Deleted

## 2018-09-16 ENCOUNTER — Inpatient Hospital Stay (HOSPITAL_COMMUNITY): Payer: Medicaid Other

## 2018-09-16 ENCOUNTER — Inpatient Hospital Stay (HOSPITAL_COMMUNITY): Payer: Medicaid Other | Admitting: Anesthesiology

## 2018-09-16 DIAGNOSIS — R7689 Other specified abnormal immunological findings in serum: Secondary | ICD-10-CM | POA: Diagnosis present

## 2018-09-16 DIAGNOSIS — Z3A41 41 weeks gestation of pregnancy: Secondary | ICD-10-CM

## 2018-09-16 DIAGNOSIS — Z880 Allergy status to penicillin: Secondary | ICD-10-CM | POA: Diagnosis not present

## 2018-09-16 DIAGNOSIS — N390 Urinary tract infection, site not specified: Secondary | ICD-10-CM | POA: Diagnosis present

## 2018-09-16 DIAGNOSIS — R768 Other specified abnormal immunological findings in serum: Secondary | ICD-10-CM | POA: Diagnosis present

## 2018-09-16 DIAGNOSIS — R001 Bradycardia, unspecified: Secondary | ICD-10-CM | POA: Diagnosis present

## 2018-09-16 DIAGNOSIS — O99214 Obesity complicating childbirth: Secondary | ICD-10-CM | POA: Diagnosis present

## 2018-09-16 DIAGNOSIS — O480 Post-term pregnancy: Principal | ICD-10-CM | POA: Diagnosis present

## 2018-09-16 DIAGNOSIS — Z9189 Other specified personal risk factors, not elsewhere classified: Secondary | ICD-10-CM

## 2018-09-16 DIAGNOSIS — O26893 Other specified pregnancy related conditions, third trimester: Secondary | ICD-10-CM | POA: Diagnosis present

## 2018-09-16 DIAGNOSIS — B9689 Other specified bacterial agents as the cause of diseases classified elsewhere: Secondary | ICD-10-CM | POA: Diagnosis present

## 2018-09-16 DIAGNOSIS — B961 Klebsiella pneumoniae [K. pneumoniae] as the cause of diseases classified elsewhere: Secondary | ICD-10-CM | POA: Diagnosis present

## 2018-09-16 LAB — CBC
HCT: 37.6 % (ref 36.0–46.0)
Hemoglobin: 12 g/dL (ref 12.0–15.0)
MCH: 25.3 pg — AB (ref 26.0–34.0)
MCHC: 31.9 g/dL (ref 30.0–36.0)
MCV: 79.2 fL — ABNORMAL LOW (ref 80.0–100.0)
Platelets: 245 10*3/uL (ref 150–400)
RBC: 4.75 MIL/uL (ref 3.87–5.11)
RDW: 16.4 % — ABNORMAL HIGH (ref 11.5–15.5)
WBC: 9 10*3/uL (ref 4.0–10.5)
nRBC: 0 % (ref 0.0–0.2)

## 2018-09-16 LAB — TYPE AND SCREEN
ABO/RH(D): A POS
Antibody Screen: NEGATIVE

## 2018-09-16 MED ORDER — SODIUM CHLORIDE (PF) 0.9 % IJ SOLN
INTRAMUSCULAR | Status: DC | PRN
  Administered 2018-09-16: 12 mL/h via EPIDURAL

## 2018-09-16 MED ORDER — LACTATED RINGERS IV SOLN: 500.0000 mL | INTRAVENOUS | Status: DC | PRN

## 2018-09-16 MED ORDER — TETANUS-DIPHTH-ACELL PERTUSSIS 5-2.5-18.5 LF-MCG/0.5 IM SUSP: 0.5000 mL | Freq: Once | INTRAMUSCULAR | Status: DC

## 2018-09-16 MED ORDER — SOD CITRATE-CITRIC ACID 500-334 MG/5ML PO SOLN: 30.0000 mL | ORAL | Status: DC | PRN

## 2018-09-16 MED ORDER — DIPHENHYDRAMINE HCL 50 MG/ML IJ SOLN: 12.5000 mg | INTRAMUSCULAR | Status: DC | PRN

## 2018-09-16 MED ORDER — OXYTOCIN 40 UNITS IN NORMAL SALINE INFUSION - SIMPLE MED
2.5000 [IU]/h | INTRAVENOUS | Status: DC
  Filled 2018-09-16: qty 1000

## 2018-09-16 MED ORDER — SENNOSIDES-DOCUSATE SODIUM 8.6-50 MG PO TABS
2.0000 | ORAL_TABLET | ORAL | Status: DC
  Administered 2018-09-16 – 2018-09-17 (×2): 2 via ORAL
  Filled 2018-09-16 (×2): qty 2

## 2018-09-16 MED ORDER — WITCH HAZEL-GLYCERIN EX PADS: 1.0000 "application " | MEDICATED_PAD | CUTANEOUS | Status: DC | PRN

## 2018-09-16 MED ORDER — LACTATED RINGERS IV SOLN: 500.0000 mL | Freq: Once | INTRAVENOUS | Status: DC

## 2018-09-16 MED ORDER — ONDANSETRON HCL 4 MG/2ML IJ SOLN: 4.0000 mg | INTRAMUSCULAR | Status: DC | PRN

## 2018-09-16 MED ORDER — ACETAMINOPHEN 325 MG PO TABS: 650.0000 mg | ORAL_TABLET | ORAL | Status: DC | PRN

## 2018-09-16 MED ORDER — DIPHENHYDRAMINE HCL 25 MG PO CAPS: 25.0000 mg | ORAL_CAPSULE | Freq: Four times a day (QID) | ORAL | Status: DC | PRN

## 2018-09-16 MED ORDER — EPHEDRINE 5 MG/ML INJ: 10.0000 mg | INTRAVENOUS | Status: DC | PRN

## 2018-09-16 MED ORDER — OXYCODONE-ACETAMINOPHEN 5-325 MG PO TABS: 1.0000 | ORAL_TABLET | ORAL | Status: DC | PRN

## 2018-09-16 MED ORDER — OXYCODONE-ACETAMINOPHEN 5-325 MG PO TABS: 2.0000 | ORAL_TABLET | ORAL | Status: DC | PRN

## 2018-09-16 MED ORDER — LIDOCAINE HCL (PF) 1 % IJ SOLN: 30.0000 mL | INTRAMUSCULAR | Status: DC | PRN

## 2018-09-16 MED ORDER — PHENYLEPHRINE 40 MCG/ML (10ML) SYRINGE FOR IV PUSH (FOR BLOOD PRESSURE SUPPORT): 80.0000 ug | PREFILLED_SYRINGE | INTRAVENOUS | Status: DC | PRN

## 2018-09-16 MED ORDER — MISOPROSTOL 25 MCG QUARTER TABLET
25.0000 ug | ORAL_TABLET | ORAL | Status: DC | PRN
  Filled 2018-09-16: qty 1

## 2018-09-16 MED ORDER — ZOLPIDEM TARTRATE 5 MG PO TABS: 5.0000 mg | ORAL_TABLET | Freq: Every evening | ORAL | Status: DC | PRN

## 2018-09-16 MED ORDER — ONDANSETRON HCL 4 MG PO TABS: 4.0000 mg | ORAL_TABLET | ORAL | Status: DC | PRN

## 2018-09-16 MED ORDER — FLEET ENEMA 7-19 GM/118ML RE ENEM: 1.0000 | ENEMA | RECTAL | Status: DC | PRN

## 2018-09-16 MED ORDER — DIBUCAINE 1 % RE OINT: 1.0000 "application " | TOPICAL_OINTMENT | RECTAL | Status: DC | PRN

## 2018-09-16 MED ORDER — PRENATAL MULTIVITAMIN CH
1.0000 | ORAL_TABLET | Freq: Every day | ORAL | Status: DC
  Administered 2018-09-17 – 2018-09-18 (×2): 1 via ORAL
  Filled 2018-09-16 (×2): qty 1

## 2018-09-16 MED ORDER — SIMETHICONE 80 MG PO CHEW: 80.0000 mg | CHEWABLE_TABLET | ORAL | Status: DC | PRN

## 2018-09-16 MED ORDER — FENTANYL-BUPIVACAINE-NACL 0.5-0.125-0.9 MG/250ML-% EP SOLN
12.0000 mL/h | EPIDURAL | Status: DC | PRN
  Filled 2018-09-16: qty 250

## 2018-09-16 MED ORDER — LACTATED RINGERS IV SOLN
INTRAVENOUS | Status: DC
  Administered 2018-09-16 (×3): via INTRAVENOUS

## 2018-09-16 MED ORDER — TERBUTALINE SULFATE 1 MG/ML IJ SOLN: 0.2500 mg | Freq: Once | INTRAMUSCULAR | Status: DC | PRN

## 2018-09-16 MED ORDER — COCONUT OIL OIL: 1.0000 "application " | TOPICAL_OIL | Status: DC | PRN

## 2018-09-16 MED ORDER — LIDOCAINE-EPINEPHRINE (PF) 2 %-1:200000 IJ SOLN
INTRAMUSCULAR | Status: DC | PRN
  Administered 2018-09-16: 2 mL via EPIDURAL
  Administered 2018-09-16: 5 mL via EPIDURAL

## 2018-09-16 MED ORDER — BENZOCAINE-MENTHOL 20-0.5 % EX AERO
1.0000 "application " | INHALATION_SPRAY | CUTANEOUS | Status: DC | PRN
  Administered 2018-09-16: 1 via TOPICAL
  Filled 2018-09-16: qty 56

## 2018-09-16 MED ORDER — LORATADINE 10 MG PO TABS
10.0000 mg | ORAL_TABLET | Freq: Every day | ORAL | Status: DC | PRN
  Administered 2018-09-16: 10 mg via ORAL
  Filled 2018-09-16 (×2): qty 1

## 2018-09-16 MED ORDER — ONDANSETRON HCL 4 MG/2ML IJ SOLN: 4.0000 mg | Freq: Four times a day (QID) | INTRAMUSCULAR | Status: DC | PRN

## 2018-09-16 MED ORDER — DIPHENHYDRAMINE HCL 25 MG PO CAPS
25.0000 mg | ORAL_CAPSULE | Freq: Four times a day (QID) | ORAL | Status: DC | PRN
  Administered 2018-09-16: 25 mg via ORAL
  Filled 2018-09-16: qty 1

## 2018-09-16 MED ORDER — OXYTOCIN 40 UNITS IN NORMAL SALINE INFUSION - SIMPLE MED
1.0000 m[IU]/min | INTRAVENOUS | Status: DC
  Administered 2018-09-16: 2 m[IU]/min via INTRAVENOUS

## 2018-09-16 MED ORDER — OXYTOCIN BOLUS FROM INFUSION
500.0000 mL | Freq: Once | INTRAVENOUS | Status: AC
  Administered 2018-09-16: 500 mL via INTRAVENOUS

## 2018-09-16 MED ORDER — IBUPROFEN 600 MG PO TABS
600.0000 mg | ORAL_TABLET | Freq: Four times a day (QID) | ORAL | Status: DC
  Administered 2018-09-16 – 2018-09-18 (×7): 600 mg via ORAL
  Filled 2018-09-16 (×7): qty 1

## 2018-09-16 NOTE — H&P (Signed)
Veronica Hensley is a 28 y.o. female, G1P0 at 1 weeks, presenting for IOL s/t Postdates. Patient receives care at [facility] and was supervised for a low risk pregnancy. Pregnancy and medical history significant for problems as listed below.    Patient Active Problem List   Diagnosis Date Noted  . Post-dates pregnancy 09/16/2018  . UTI due to Klebsiella species 05/24/2018  . Biological false positive RPR test 05/08/2018  . Supervision of normal pregnancy 04/26/2018    History of present pregnancy:  Nursing Staff Provider  Office Location  [facility] Dating   LMP  Language   english Anatomy US  05-06-18  Flu Vaccine   09/13/2018 Genetic Screen  NIPS: Negative 04-26-2018  AFP: -    TDaP vaccine  09/13/2018 Hgb A1C or  GTT Early  Third trimester 2 hour wnl  9Rhogam  n/a   LAB RESULTS   Feeding Plan breast Blood Type A/Positive/-- (10/28 1139)   Contraception Mirena Antibody Negative (10/28 1139)  Circumcision N/A female Rubella 1.96 (10/28 1139)  Pediatrician  Undecided 09/06/18 RPR Reactive (01/09 1135) T. Pal: Neg.  Support Person FOB HBsAg Negative (10/28 1139)   Prenatal Classes No  HIV Non Reactive (01/09 1135)  BTL Consent  GBS  (For PCN allergy, check sensitivities)   VBAC Consent  Pap 04-26-2018  NILM    Hgb Electro   04-26-2018 Normal    CF 04-26-2018 Negative    SMA 04-26-2018 Negative    Waterbirth  [ ]  Class [ ]  Consent [ ]  CNM visit    Last evaluation:  September 13, 2018 in office by Dr. Clearance Coots; Last Cervical Exam 09/06/2018: 1-2/70/-2/Vtx   OB History    Gravida  1   Para      Term      Preterm      AB      Living        SAB      TAB      Ectopic      Multiple      Live Births                Past Medical History:  Diagnosis Date  . Medical history non-contributory    Past Surgical History:  Procedure Laterality Date  . NO PAST SURGERIES     Family History: family history includes Diabetes in her father; Hypertension in her father and mother; Stroke in  her father. Social History:  reports that she has never smoked. She has never used smokeless tobacco. She reports previous alcohol use. She reports that she does not use drugs.   Prenatal Transfer Tool  Maternal Diabetes: No Genetic Screening: Normal Maternal Ultrasounds/Referrals: Normal Fetal Ultrasounds or other Referrals:  None Maternal Substance Abuse:  No Significant Maternal Medications:  None Significant Maternal Lab Results: Lab values include: Group B Strep negative   Maternal Assessment:  ROS: -Contractions, -LOF, -Vaginal Bleeding, +Fetal Movement  All other systems reviewed and negative.    No Known Allergies   Dilation: 3.5 Effacement (%): 80 Station: -1 Exam by:: J.Cox, RN Blood pressure 113/66, pulse (!) 55, height 5\' 6"  (1.676 m), weight 120.7 kg, last menstrual period 12/03/2017.  Physical Exam  Constitutional: She is oriented to person, place, and time. She appears well-developed and well-nourished. No distress.  HENT:  Head: Normocephalic and atraumatic.  Eyes: Conjunctivae are normal.  Neck: Normal range of motion.  Cardiovascular: Normal rate, regular rhythm and normal heart sounds.  Respiratory: Effort normal and breath sounds normal.  GI: Soft.  Musculoskeletal: Normal range of motion.  Neurological: She is alert and oriented to person, place, and time.  Skin: Skin is warm and dry.  Psychiatric: She has a normal mood and affect. Her behavior is normal.    Fetal Assessment: Leopolds: -Pelvis:Deferred -EFW: 7 1/2-3/4lbs -Presentation: Vertex by RN Exam  FHR: 135 bpm, Mod Var, -Decels, +Accels UCs:  None graphed or palpated    Assessment IUP at 41 weeks Cat I FT PDIOL Bishop Score:10 GBS Negative   Plan: Admit to [facility]  Routine Labor and Delivery Orders per Protocol In room to complete assessment and discuss POC: Start pitocin due to advanced dilation Will plan for AROM in ~4-5 hours No questions or concerns expressed  from patient or FOB-Aaron Routine Induction/Augmentation Orders placed. Continue other mgmt as ordered  Joellyn Quails, MSN 09/16/2018, 10:19 AM

## 2018-09-16 NOTE — Anesthesia Procedure Notes (Signed)
Epidural Patient location during procedure: OB Start time: 09/16/2018 4:18 PM End time: 09/16/2018 4:30 PM  Staffing Anesthesiologist: Lucretia Kern, MD Performed: anesthesiologist   Preanesthetic Checklist Completed: patient identified, pre-op evaluation, timeout performed, IV checked, risks and benefits discussed and monitors and equipment checked  Epidural Patient position: sitting Prep: DuraPrep Patient monitoring: heart rate, continuous pulse ox and blood pressure Approach: midline Location: L3-L4 Injection technique: LOR air  Needle:  Needle type: Tuohy  Needle gauge: 17 G Needle length: 9 cm Needle insertion depth: 6 cm Catheter type: closed end flexible Catheter size: 19 Gauge Catheter at skin depth: 11 cm Test dose: negative and 2% lidocaine with Epi 1:200 K  Assessment Events: blood not aspirated, injection not painful, no injection resistance, negative IV test and no paresthesia  Additional Notes Reason for block:procedure for pain

## 2018-09-16 NOTE — Discharge Summary (Signed)
Postpartum Discharge Summary     Patient Name: Veronica Hensley DOB: 1990-07-23 MRN: [id number]  Date of admission: 09/16/2018 Delivering Provider: Gerrit Heck , CNM  Date of discharge: 09/18/2018  Admitting diagnosis: pregnancy Intrauterine pregnancy: [redacted]w[redacted]d     Secondary diagnosis:  Active Problems:   Biological false positive RPR test   UTI due to Klebsiella species   SVD (spontaneous vaginal delivery)   Obstetric labial laceration, delivered, current hospitalization   Bradycardia, unspecified  Additional problems: none     Discharge diagnosis: Term Pregnancy Delivered                                                                                                Post partum procedures:EKG   Augmentation: AROM and Pitocin  Complications: None  Hospital course:  Induction of Labor With Vaginal Delivery   28 y.o. yo G1P0 at [redacted]w[redacted]d was admitted to the hospital 09/16/2018 for induction of labor.  Indication for induction: Postdates.  Patient had an uncomplicated labor course as follows: Membrane Rupture Time/Date: 2:40 PM ,09/16/2018   Intrapartum Procedures: Episiotomy: None [1]                                         Lacerations:  Labial [10]  Patient had delivery of a Viable infant.  Information for the patient's newborn:  Jeanita, [place] Girl Jazyiah [[id number]]  Delivery Method: Vaginal, Spontaneous(Filed from Delivery Summary)   09/16/2018  Details of delivery can be found in separate delivery note.  Patient had a postpartum course remarkable for bradycardia, which isn't atypical for her, with an abnl EKG on PPD#1 (see below), and an EKG showing sinus tachycardia with occ PVCs on PPD#2. Patient is discharged home 09/18/18.  Asymptomatic Bradycardia: chronic EKG: Ventricular rate: 94 BPM. QTc 497. Accelerated junctional rhythm with premature supraventricular complexes in a pattern of bigeminy, incomplete LBBB, ST & T wave abnormality, prolonged QT  Prior EKG 2018: NSR - Prolonged QT - QT  prolonging meds avoided during  - Given asymptomatic, no further workup was completed while inpatient. - Recommend repeat EKG at postpartum visit. Consider echo, TSH, CMP, CBC if symptomatic or no improvement in EKG. Consider cards referral as well.   Magnesium Sulfate recieved: No BMZ received: No  Physical exam  Vitals:   09/17/18 1830 09/17/18 2223 09/18/18 0226 09/18/18 0625  BP: 108/65 (!) 124/59 (!) 113/51 (!) 114/50  Pulse: 91 (!) 51 (!) 49 (!) 50  Resp: 18 18 20 18   Temp: 97.9 F (36.6 C) 97.9 F (36.6 C) 98 F (36.7 C) 98 F (36.7 C)  TempSrc: Oral Oral Oral Oral  SpO2: 95% 96% 97% 96%  Weight:      Height:       General: alert and cooperative Lochia: appropriate Uterine Fundus: firm Incision: N/A DVT Evaluation: No evidence of DVT seen on physical exam. Labs: Lab Results  Component Value Date   WBC 11.3 (H) 09/17/2018   HGB 10.6 (L) 09/17/2018   HCT 32.3 (L) 09/17/2018  MCV 76.9 (L) 09/17/2018   PLT 205 09/17/2018   CMP Latest Ref Rng & Units 06/25/2017  Glucose 65 - 99 mg/dL 89  BUN 6 - 20 mg/dL 10  Creatinine 6.25 - 6.38 mg/dL 9.37  Sodium 342 - 876 mmol/L 138  Potassium 3.5 - 5.1 mmol/L 3.7  Chloride 101 - 111 mmol/L 105  CO2 22 - 32 mmol/L 19(L)  Calcium 8.9 - 10.3 mg/dL 9.7    Discharge instruction: per After Visit Summary and "Baby and Me Booklet".  After visit meds:  Allergies as of 09/18/2018   No Known Allergies     Medication List    TAKE these medications   ibuprofen 600 MG tablet Commonly known as:  ADVIL,MOTRIN Take 1 tablet (600 mg total) by mouth every 6 (six) hours as needed.   Vitafol Ultra 29-0.6-0.4-200 MG Caps Take 1 capsule by mouth daily before breakfast.       Diet: routine diet  Activity: Advance as tolerated. Pelvic rest for 6 weeks.   Outpatient follow up:4 weeks Follow up Appt: Future Appointments  Date Time Provider Department Center  10/14/2018 10:00 AM Brock Bad, MD [facility] None   Follow up  Visit:   Please schedule this patient for Postpartum visit in: 4 weeks with the following provider: Any provider For C/S patients schedule nurse incision check in weeks 2 weeks: no Low risk pregnancy complicated by: None Delivery mode:  SVD Anticipated Birth Control:  IUD-Mirena PP Procedures needed: Postpartum EKG. Consider echo if still abnormal   Schedule Integrated BH visit: no    Newborn Data: Live born female-Ava Birth Weight:   APGAR: 8, 9  Newborn Delivery   Birth date/time:  09/16/2018 18:04:00 Delivery type:  Vaginal, Spontaneous     Baby Feeding: Bottle and Breast Disposition:home with mother   09/18/2018 Arabella Merles, CNM  10:22 AM

## 2018-09-16 NOTE — Anesthesia Preprocedure Evaluation (Signed)
Anesthesia Evaluation  Patient identified by MRN, date of birth, ID band Patient awake    Reviewed: Allergy & Precautions, H&P , NPO status , Patient's Chart, lab work & pertinent test results  History of Anesthesia Complications Negative for: history of anesthetic complications  Airway Mallampati: II  TM Distance: >3 FB Neck ROM: full    Dental no notable dental hx.    Pulmonary neg pulmonary ROS,    Pulmonary exam normal        Cardiovascular negative cardio ROS Normal cardiovascular exam Rhythm:regular Rate:Normal     Neuro/Psych negative neurological ROS  negative psych ROS   GI/Hepatic negative GI ROS, Neg liver ROS,   Endo/Other  Morbid obesity  Renal/GU negative Renal ROS  negative genitourinary   Musculoskeletal   Abdominal   Peds  Hematology negative hematology ROS (+)   Anesthesia Other Findings   Reproductive/Obstetrics (+) Pregnancy                             Anesthesia Physical Anesthesia Plan  ASA: III  Anesthesia Plan: Epidural   Post-op Pain Management:    Induction:   PONV Risk Score and Plan:   Airway Management Planned:   Additional Equipment:   Intra-op Plan:   Post-operative Plan:   Informed Consent: I have reviewed the patients History and Physical, chart, labs and discussed the procedure including the risks, benefits and alternatives for the proposed anesthesia with the patient or authorized representative who has indicated his/her understanding and acceptance.       Plan Discussed with:   Anesthesia Plan Comments:         Anesthesia Quick Evaluation  

## 2018-09-16 NOTE — Progress Notes (Signed)
Evarista Reichenberger MRN: [id number]  Subjective: -Patient resting in bed.  Reports comfort.  No questions or concerns.   Objective: BP (!) 119/45   Pulse 78   Ht 5\' 6"  (1.676 m)   Wt 120.7 kg   LMP 12/03/2017   BMI 42.93 kg/m  No intake/output data recorded. No intake/output data recorded.  Fetal Monitoring: FHT: 135 bpm, Mod Var, -Decels, +Accels UC: None graphed    Vaginal Exam: SVE:   Dilation: 3.5 Effacement (%): 80 Station: -1 Exam by:: J.Cox, RN Membranes:AROM of scant amt clear fluid Internal Monitors: IUPC placed  Augmentation/Induction: Pitocin:71mUn/min Cytotec: None  Assessment:  IUP at 41 weeks  Cat I FT  Amniotomy  Plan: -Discussed AROM r/b including increased risk of infection, cord prolapse, fetal intolerance, and decreased labor time. No questions or concerns and patient desires to proceed with AROM.  -Discussed IUPC r/b, prior to insertion, including increased risk of infection and ability to adequately monitor quantity and strength of contractions. -Above procedures performed without incident and patient tolerated well.  -Continue other mgmt as ordered   Valma Cava, CNM 09/16/2018, 2:43 PM  Addendum -Nurse reports thick meconium noted on pad with position change. -Will continue to monitor.

## 2018-09-16 NOTE — Anesthesia Pain Management Evaluation Note (Signed)
  CRNA Pain Management Visit Note  Patient: Veronica Hensley, 28 y.o., female  "Hello I am a member of the anesthesia team at [facility] and [employer]. We have an anesthesia team available at all times to provide care throughout the hospital, including epidural management and anesthesia for C-section. I don't know your plan for the delivery whether it a natural birth, water birth, IV sedation, nitrous supplementation, doula or epidural, but we want to meet your pain goals."   1.Was your pain managed to your expectations on prior hospitalizations?   No prior hospitalizations  2.What is your expectation for pain management during this hospitalization?     Labor support without medications  3.How can we help you reach that goal? Be available for emergency.  Record the patient's initial score and the patient's pain goal.   Pain: 0  Pain Goal: 10 [facility] wants you to be able to say your pain was always managed very well.  Veronica Hensley 09/16/2018

## 2018-09-17 LAB — RPR: RPR Ser Ql: REACTIVE — AB

## 2018-09-17 LAB — CBC
HCT: 32.3 % — ABNORMAL LOW (ref 36.0–46.0)
Hemoglobin: 10.6 g/dL — ABNORMAL LOW (ref 12.0–15.0)
MCH: 25.2 pg — AB (ref 26.0–34.0)
MCHC: 32.8 g/dL (ref 30.0–36.0)
MCV: 76.9 fL — ABNORMAL LOW (ref 80.0–100.0)
Platelets: 205 10*3/uL (ref 150–400)
RBC: 4.2 MIL/uL (ref 3.87–5.11)
RDW: 16 % — ABNORMAL HIGH (ref 11.5–15.5)
WBC: 11.3 10*3/uL — ABNORMAL HIGH (ref 4.0–10.5)
nRBC: 0 % (ref 0.0–0.2)

## 2018-09-17 LAB — RPR, QUANT+TP ABS (REFLEX)
Rapid Plasma Reagin, Quant: 1:1 {titer} — ABNORMAL HIGH
T Pallidum Abs: NONREACTIVE

## 2018-09-17 LAB — ABO/RH: ABO/RH(D): A POS

## 2018-09-17 MED ORDER — SODIUM CHLORIDE 0.9 % IV BOLUS
1000.0000 mL | Freq: Once | INTRAVENOUS | Status: AC
  Administered 2018-09-17: 1000 mL via INTRAVENOUS

## 2018-09-17 NOTE — Anesthesia Postprocedure Evaluation (Signed)
Anesthesia Post Note  Patient: Veronica Hensley  Procedure(s) Performed: AN AD HOC LABOR EPIDURAL     Patient location during evaluation: Mother Baby Anesthesia Type: Epidural Level of consciousness: awake and alert and oriented Pain management: satisfactory to patient Vital Signs Assessment: post-procedure vital signs reviewed and stable Respiratory status: spontaneous breathing and nonlabored ventilation Cardiovascular status: stable Postop Assessment: no headache, no backache, no signs of nausea or vomiting, adequate PO intake, patient able to bend at knees and able to ambulate (patient up walking) Anesthetic complications: no    Last Vitals:  Vitals:   09/17/18 0955 09/17/18 1025  BP: (!) 100/45 (!) 105/51  Pulse: (!) 48 (!) 48  Resp: 16 16  Temp: 36.7 C 36.6 C  SpO2: 95% 95%    Last Pain:  Vitals:   09/17/18 1025  TempSrc: Oral  PainSc: 0-No pain   Pain Goal:                   Manvir Thorson

## 2018-09-17 NOTE — Lactation Note (Addendum)
This note was copied from a baby's chart. Lactation Consultation Note  Patient Name: Veronica Hensley CLEXN'T Date: 09/17/2018 Reason for consult: Initial assessment;Term P1, 12 hour female infant, infant is SGA and under 6 lbs.  Per mom, she attended BF classes in [facility]. LC entered room mom has bottle on counter of Gerber Gentle with iron 20 kcal. Mom's feeding choice at admission was breastfeeding and supplementing with formula. Per mom, she doesn't have breast pump at home. LC gave harmony hand pump and explained how to assemble, re-assemble, clean and store breast milk.  Per parents, infant had 2 wet and 2 stools since delivery. Mom latched infant on right breast using cross cradle hold, infant nose to breast with wide mouth gape infant breastfeed for 15 minutes and few swallows observed. Mom is feeling confident with breastfeed, per mom that is the longest infant has breastfeed, LC discussed tips to keep infant awake at breast. Mom supplement with formula, infant only take 4 ml of Gerber Good start with iron by slow flow bottle nipple. Mom knows to breastfeed according hunger cues, 8 or more times within 24 hours. LC discussed I & O. Reviewed Baby & Me book's Breastfeeding Basics.  Mom will call Nurse or LC if she has any questions, concerns or need assistance latching infant to breast. Mom made aware of O/P services, breastfeeding support groups, community resources, and our phone # for post-discharge questions.  Maternal Data Formula Feeding for Exclusion: Yes Reason for exclusion: Mother's choice to formula and breast feed on admission Has patient been taught Hand Expression?: Yes Does the patient have breastfeeding experience prior to this delivery?: No  Feeding Feeding Type: Breast Fed  LATCH Score Latch: Grasps breast easily, tongue down, lips flanged, rhythmical sucking.  Audible Swallowing: A few with stimulation  Type of Nipple: Everted at rest and after  stimulation  Comfort (Breast/Nipple): Soft / non-tender  Hold (Positioning): Assistance needed to correctly position infant at breast and maintain latch.  LATCH Score: 8  Interventions Interventions: Breast feeding basics reviewed;Assisted with latch;Skin to skin;Breast massage;Hand express;Adjust position;Support pillows;Expressed milk;Position options;Hand pump  Lactation Tools Discussed/Used WIC Program: Yes Pump Review: Setup, frequency, and cleaning;Milk Storage Initiated by:: Danelle Earthly, IBCLC Date initiated:: 09/17/18   Consult Status Consult Status: Follow-up Date: 09/17/18 Follow-up type: In-patient    Danelle Earthly 09/17/2018, 6:05 AM

## 2018-09-17 NOTE — Progress Notes (Signed)
Post Partum Day 1 Subjective: Doing well this morning. No particular complaints. States pain well-controlled. Has been up to bathroom okay. Was able to get a little bit of sleep overnight, breastfeeding going well.   Objective: Blood pressure 98/60, pulse 91, temperature 97.9 F (36.6 C), temperature source Oral, resp. rate 18, height 5\' 6"  (1.676 m), weight 120.7 kg, last menstrual period 12/03/2017, SpO2 96 %, unknown if currently breastfeeding.  Physical Exam:  General: alert, well-appearing, NAD Lochia: appropriate Uterine Fundus: firm Incision: n/a DVT Evaluation: No significant calf/ankle edema  Recent Labs    09/16/18 0831 09/17/18 0546  HGB 12.0 10.6*  HCT 37.6 32.3*    Assessment/Plan: Plan for discharge tomorrow  Continue lactation support, routine postpartum care   LOS: 1 day   Veronica Hensley S [name], DO 09/17/2018, 9:30 AM

## 2018-09-17 NOTE — Progress Notes (Signed)
OB/GYN Interim Progress Note  S:Received page from nurse concerning low BP (100/46) and bradycardia (46). Went to see patient. Denied any chest pain, SOB, palpitations, dizziness, lightheadedness. Patient notes ambulating well. Denies any cardiac history. Endorses history of having slow heart rate.   O: BP (!) 105/51 (BP Location: Left Arm)   Pulse (!) 48   Temp 97.9 F (36.6 C) (Oral)   Resp 16   Ht 5\' 6"  (1.676 m)   Wt 120.7 kg   LMP 12/03/2017   SpO2 95%   Breastfeeding Unknown   BMI 42.93 kg/m   Gen: well appearing, sitting comfortably in bed with baby, no acute distress, nontoxic appearing  EKG: HR 94. Prolonged QT: 497.  Impression: accelerated junctional rhythm with premature supraventricular complexes in a pattern of bigeminy, incomplete left bundle branch block, ST&T wave abnormality (Consider inferior ischemia), prolonged QT  A/P: Asymptomatic Bradycardia:  Per chart review, bradycardia (baseline 40-50's) is chronic for patient. Prior EKG in 2018 with NSR. Discussed EKG with Dr. Macon Large and at this time given she is asymptomatic, will hold off on echocardiogram. Will recommend repeat EKG at postpartum visit in 1-2 weeks. If becomes symptomatic, will order echo. Will give 1L NS bolus and monitor vitals q4 hours x 12 hours with pulse ox. Discussed plan with patient and she is agreeable.  Prolonged QT: avoid QT prolonging meds  Joana Reamer, DO 09/17/2018, 12:51 PM PGY-1, [facility]

## 2018-09-18 DIAGNOSIS — R001 Bradycardia, unspecified: Secondary | ICD-10-CM | POA: Diagnosis present

## 2018-09-18 HISTORY — DX: Bradycardia, unspecified: R00.1

## 2018-09-18 MED ORDER — IBUPROFEN 600 MG PO TABS: 600.0000 mg | ORAL_TABLET | Freq: Four times a day (QID) | ORAL | 0 refills | Status: DC | PRN

## 2018-09-18 NOTE — Progress Notes (Signed)
CSW received consult for MOB due to EPDS score of 8 and answered yes to question 10. CSW met with MOB, newborn Ava, and FOB Yaaron at bedside to complete assessment. CSW obtained permission from MOB to speak with FOB present. CSW inquired with MOB regarding any mental health history, MOB denies any diagnoses but states she has had mild anxiety throughout her adult years. MOB stated that her answering yes to question one was completely an error. CSW encouraged MOB to understand that if she truly had those thoughts or feelings we could address them, MOB adamantly denied that she ever considered self harm. MOB denies any current suicidal or homicidal ideations. MOB reports that she uses self coping mechanisms to address her symptoms. MOB reports that she has never had any issue dealing with her concerns. MOB reports that this is her first child. MOB reports that she is breast and bottle feeding. MOB reports that she has a great support system of friends and family. MOB was educated on baby blues period versus postpartum depression. MOB states she feels comfortable reaching out to her OB provider for an assessment if necessary. MOB had no further questions or concerns to address.  Madilyn Fireman, MSW, LCSW-A Clinical Social Worker [facility] [employer] [phone]

## 2018-09-18 NOTE — Discharge Instructions (Signed)
Vaginal Delivery, Care After °Refer to this sheet in the next few weeks. These instructions provide you with information about caring for yourself after vaginal delivery. Your health care provider may also give you more specific instructions. Your treatment has been planned according to current medical practices, but problems sometimes occur. Call your health care provider if you have any problems or questions. °What can I expect after the procedure? °After vaginal delivery, it is common to have: °· Some bleeding from your vagina. °· Soreness in your abdomen, your vagina, and the area of skin between your vaginal opening and your anus (perineum). °· Pelvic cramps. °· Fatigue. °Follow these instructions at home: °Medicines °· Take over-the-counter and prescription medicines only as told by your health care provider. °· If you were prescribed an antibiotic medicine, take it as told by your health care provider. Do not stop taking the antibiotic until it is finished. °Driving ° °· Do not drive or operate heavy machinery while taking prescription pain medicine. °· Do not drive for 24 hours if you received a sedative. °Lifestyle °· Do not drink alcohol. This is especially important if you are breastfeeding or taking medicine to relieve pain. °· Do not use tobacco products, including cigarettes, chewing tobacco, or e-cigarettes. If you need help quitting, ask your health care provider. °Eating and drinking °· Drink at least 8 eight-ounce glasses of water every day unless you are told not to by your health care provider. If you choose to breastfeed your baby, you may need to drink more water than this. °· Eat high-fiber foods every day. These foods may help prevent or relieve constipation. High-fiber foods include: °? Whole grain cereals and breads. °? Brown rice. °? Beans. °? Fresh fruits and vegetables. °Activity °· Return to your normal activities as told by your health care provider. Ask your health care provider what  activities are safe for you. °· Rest as much as possible. Try to rest or take a nap when your baby is sleeping. °· Do not lift anything that is heavier than your baby or 10 lb (4.5 kg) until your health care provider says that it is safe. °· Talk with your health care provider about when you can engage in sexual activity. This may depend on your: °? Risk of infection. °? Rate of healing. °? Comfort and desire to engage in sexual activity. °Vaginal Care °· If you have an episiotomy or a vaginal tear, check the area every day for signs of infection. Check for: °? More redness, swelling, or pain. °? More fluid or blood. °? Warmth. °? Pus or a bad smell. °· Do not use tampons or douches until your health care provider says this is safe. °· Watch for any blood clots that may pass from your vagina. These may look like clumps of dark red, brown, or black discharge. °General instructions °· Keep your perineum clean and dry as told by your health care provider. °· Wear loose, comfortable clothing. °· Wipe from front to back when you use the toilet. °· Ask your health care provider if you can shower or take a bath. If you had an episiotomy or a perineal tear during labor and delivery, your health care provider may tell you not to take baths for a certain length of time. °· Wear a bra that supports your breasts and fits you well. °· If possible, have someone help you with household activities and help care for your baby for at least a few days after you   leave the hospital. °· Keep all follow-up visits for you and your baby as told by your health care provider. This is important. °Contact a health care provider if: °· You have: °? Vaginal discharge that has a bad smell. °? Difficulty urinating. °? Pain when urinating. °? A sudden increase or decrease in the frequency of your bowel movements. °? More redness, swelling, or pain around your episiotomy or vaginal tear. °? More fluid or blood coming from your episiotomy or vaginal  tear. °? Pus or a bad smell coming from your episiotomy or vaginal tear. °? A fever. °? A rash. °? Little or no interest in activities you used to enjoy. °? Questions about caring for yourself or your baby. °· Your episiotomy or vaginal tear feels warm to the touch. °· Your episiotomy or vaginal tear is separating or does not appear to be healing. °· Your breasts are painful, hard, or turn red. °· You feel unusually sad or worried. °· You feel nauseous or you vomit. °· You pass large blood clots from your vagina. If you pass a blood clot from your vagina, save it to show to your health care provider. Do not flush blood clots down the toilet without having your health care provider look at them. °· You urinate more than usual. °· You are dizzy or light-headed. °· You have not breastfed at all and you have not had a menstrual period for 12 weeks after delivery. °· You have stopped breastfeeding and you have not had a menstrual period for 12 weeks after you stopped breastfeeding. °Get help right away if: °· You have: °? Pain that does not go away or does not get better with medicine. °? Chest pain. °? Difficulty breathing. °? Blurred vision or spots in your vision. °? Thoughts about hurting yourself or your baby. °· You develop pain in your abdomen or in one of your legs. °· You develop a severe headache. °· You faint. °· You bleed from your vagina so much that you fill two sanitary pads in one hour. °This information is not intended to replace advice given to you by your health care provider. Make sure you discuss any questions you have with your health care provider. °Document Released: 06/13/2000 Document Revised: 11/28/2015 Document Reviewed: 07/01/2015 °Elsevier Interactive Patient Education © 2019 [employer] Inc[employer] ° °

## 2018-09-18 NOTE — Lactation Note (Signed)
This note was copied from a baby's chart. Lactation Consultation Note  Patient Name: Veronica Hensley [id number]'P Date: 09/18/2018 Reason for consult: Follow-up assessment;Infant < 6lbs;Term;Primapara Baby is 42 hours/6% weight loss.  Mom states baby latches but feedings are short.  Baby is receiving formula supplementation with neosure 22 calorie formula.  Mom has used the manual pump some.  Discussed initiating a DEBP but she would like to continue using the manual pump.  Discussed milk coming to volume and the prevention and treatment of engorgement.  Mom knows she can call [facility] for DEBP if she desires.  Encouraged to call for assist/concerns prn.  Maternal Data    Feeding Feeding Type: Bottle Fed - Formula Nipple Type: Slow - flow  LATCH Score                   Interventions    Lactation Tools Discussed/Used     Consult Status Consult Status: Follow-up Date: 09/19/18 Follow-up type: In-patient    Huston Foley 09/18/2018, 12:09 PM

## 2018-09-18 NOTE — Progress Notes (Signed)
Notified Dr. Vear Clock of EKG result now available.

## 2018-10-14 ENCOUNTER — Encounter: Payer: Self-pay | Admitting: Obstetrics

## 2018-10-14 ENCOUNTER — Ambulatory Visit (INDEPENDENT_AMBULATORY_CARE_PROVIDER_SITE_OTHER): Payer: Medicaid Other | Admitting: Obstetrics

## 2018-10-14 DIAGNOSIS — Z3009 Encounter for other general counseling and advice on contraception: Secondary | ICD-10-CM

## 2018-10-14 DIAGNOSIS — Z1389 Encounter for screening for other disorder: Secondary | ICD-10-CM

## 2018-10-14 NOTE — Progress Notes (Signed)
Post Partum Exam  Veronica Hensley is a 28 y.o. G42P1001 female who presents for a postpartum visit. She is 4 weeks postpartum following a spontaneous vaginal delivery. I have fully reviewed the prenatal and intrapartum course. The delivery was at 41 gestational weeks.  Anesthesia: epidural. Postpartum course has been uncomplicated. Baby's course has been uncomplicated. Baby is feeding by bottle - Similac Neosure. Bleeding no bleeding. Bowel function is normal. Bladder function is normal. Patient is not sexually active. Contraception method is none. Postpartum depression screening:neg  The following portions of the patient's history were reviewed and updated as appropriate: allergies, current medications, past family history, past medical history, past social history, past surgical history and problem list. Last pap smear done 04-26-2018 and was Normal  Review of Systems A comprehensive review of systems was negative.    Objective:  Blood pressure 121/84, pulse 71, height 5\' 6"  (1.676 m), weight 250 lb 9.6 oz (113.7 kg), last menstrual period 12/03/2017, not currently breastfeeding.  General:  alert and no distress   Breasts:  inspection negative, no nipple discharge or bleeding, no masses or nodularity palpable  Lungs: clear to auscultation bilaterally  Heart:  regular rate and rhythm, S1, S2 normal, no murmur, click, rub or gallop  Abdomen: soft, non-tender; bowel sounds normal; no masses,  no organomegaly   Assessment:    1. Postpartum care following vaginal delivery  2. Encounter for other general counseling and advice on contraception - wants Nexplanon   Plan:   1. Contraception: Nexplanon 2. Continue PNV's 3. Follow up in: 2 weeks or as needed.    Brock Bad MD 10-14-2018

## 2018-10-29 ENCOUNTER — Other Ambulatory Visit: Payer: Self-pay

## 2018-10-29 ENCOUNTER — Encounter: Payer: Self-pay | Admitting: Medical

## 2018-10-29 ENCOUNTER — Ambulatory Visit (INDEPENDENT_AMBULATORY_CARE_PROVIDER_SITE_OTHER): Payer: Medicaid Other | Admitting: Medical

## 2018-10-29 VITALS — BP 117/78 | HR 82 | Temp 98.3°F | Wt 252.0 lb

## 2018-10-29 DIAGNOSIS — Z30017 Encounter for initial prescription of implantable subdermal contraceptive: Secondary | ICD-10-CM

## 2018-10-29 DIAGNOSIS — Z3202 Encounter for pregnancy test, result negative: Secondary | ICD-10-CM | POA: Diagnosis not present

## 2018-10-29 LAB — POCT URINE PREGNANCY: Preg Test, Ur: NEGATIVE

## 2018-10-29 MED ORDER — ETONOGESTREL 68 MG ~~LOC~~ IMPL
68.0000 mg | DRUG_IMPLANT | Freq: Once | SUBCUTANEOUS | Status: AC
  Administered 2018-10-29: 68 mg via SUBCUTANEOUS

## 2018-10-29 NOTE — Progress Notes (Signed)
GYNECOLOGY CLINIC PROCEDURE NOTE  Ms. Veronica Hensley is a 28 y.o. G1P1001 here for Nexplanon insertion. No GYN concerns.  Last pap smear was on 03/2018 and was normal.  No other gynecologic concerns.  Nexplanon Insertion Procedure Patient was given informed consent, she signed consent form.  Patient does understand that irregular bleeding is a very common side effect of this medication. She was advised to have backup contraception for one week after placement. Pregnancy test in clinic today was negative.  Appropriate time out taken.  Patient's left arm was prepped and draped in the usual sterile fashion.. The ruler used to measure and mark insertion area.  Patient was prepped with alcohol swab and then injected with 3 ml of 1% lidocaine.  She was prepped with betadine, Nexplanon removed from packaging,  Device confirmed in needle, then inserted full length of needle and withdrawn per handbook instructions. Nexplanon was able to palpated in the patient's arm; patient palpated the insert herself. There was minimal blood loss.  Patient insertion site covered with guaze and a pressure bandage to reduce any bruising.  The patient tolerated the procedure well and was given post procedure instructions.    Marny Lowenstein, PA-C 10/29/2018 10:27 AM

## 2018-10-29 NOTE — Patient Instructions (Signed)
Etonogestrel implant What is this medicine? ETONOGESTREL (et oh noe JES trel) is a contraceptive (birth control) device. It is used to prevent pregnancy. It can be used for up to 3 years. This medicine may be used for other purposes; ask your health care provider or pharmacist if you have questions. COMMON BRAND NAME(S): Implanon, Nexplanon What should I tell my health care provider before I take this medicine? They need to know if you have any of these conditions: -abnormal vaginal bleeding -blood vessel disease or blood clots -breast, cervical, endometrial, ovarian, liver, or uterine cancer -diabetes -gallbladder disease -heart disease or recent heart attack -high blood pressure -high cholesterol or triglycerides -kidney disease -liver disease -migraine headaches -seizures -stroke -tobacco smoker -an unusual or allergic reaction to etonogestrel, anesthetics or antiseptics, other medicines, foods, dyes, or preservatives -pregnant or trying to get pregnant -breast-feeding How should I use this medicine? This device is inserted just under the skin on the inner side of your upper arm by a health care professional. Talk to your pediatrician regarding the use of this medicine in children. Special care may be needed. Overdosage: If you think you have taken too much of this medicine contact a poison control center or emergency room at once. NOTE: This medicine is only for you. Do not share this medicine with others. What if I miss a dose? This does not apply. What may interact with this medicine? Do not take this medicine with any of the following medications: -amprenavir -fosamprenavir This medicine may also interact with the following medications: -acitretin -aprepitant -armodafinil -bexarotene -bosentan -carbamazepine -certain medicines for fungal infections like fluconazole, ketoconazole, itraconazole and voriconazole -certain medicines to treat hepatitis, HIV or  AIDS -cyclosporine -felbamate -griseofulvin -lamotrigine -modafinil -oxcarbazepine -phenobarbital -phenytoin -primidone -rifabutin -rifampin -rifapentine -St. John's wort -topiramate This list may not describe all possible interactions. Give your health care provider a list of all the medicines, herbs, non-prescription drugs, or dietary supplements you use. Also tell them if you smoke, drink alcohol, or use illegal drugs. Some items may interact with your medicine. What should I watch for while using this medicine? This product does not protect you against HIV infection (AIDS) or other sexually transmitted diseases. You should be able to feel the implant by pressing your fingertips over the skin where it was inserted. Contact your doctor if you cannot feel the implant, and use a non-hormonal birth control method (such as condoms) until your doctor confirms that the implant is in place. Contact your doctor if you think that the implant may have broken or become bent while in your arm. You will receive a user card from your health care provider after the implant is inserted. The card is a record of the location of the implant in your upper arm and when it should be removed. Keep this card with your health records. What side effects may I notice from receiving this medicine? Side effects that you should report to your doctor or health care professional as soon as possible: -allergic reactions like skin rash, itching or hives, swelling of the face, lips, or tongue -breast lumps, breast tissue changes, or discharge -breathing problems -changes in emotions or moods -if you feel that the implant may have broken or bent while in your arm -high blood pressure -pain, irritation, swelling, or bruising at the insertion site -scar at site of insertion -signs of infection at the insertion site such as fever, and skin redness, pain or discharge -signs and symptoms of a blood clot such   as breathing  problems; changes in vision; chest pain; severe, sudden headache; pain, swelling, warmth in the leg; trouble speaking; sudden numbness or weakness of the face, arm or leg -signs and symptoms of liver injury like dark yellow or brown urine; general ill feeling or flu-like symptoms; light-colored stools; loss of appetite; nausea; right upper belly pain; unusually weak or tired; yellowing of the eyes or skin -unusual vaginal bleeding, discharge Side effects that usually do not require medical attention (report to your doctor or health care professional if they continue or are bothersome): -acne -breast pain or tenderness -headache -irregular menstrual bleeding -nausea This list may not describe all possible side effects. Call your doctor for medical advice about side effects. You may report side effects to FDA at 1-800-FDA-1088. Where should I keep my medicine? This drug is given in a hospital or clinic and will not be stored at home. NOTE: This sheet is a summary. It may not cover all possible information. If you have questions about this medicine, talk to your doctor, pharmacist, or health care provider.  2019 Elsevier/Gold Standard (2017-05-05 14:11:42) Nexplanon Instructions After Insertion   Keep bandage clean and dry for 24 hours   May use ice/Tylenol/Ibuprofen for soreness or pain   If you develop fever, drainage or increased warmth from incision site-contact office immediately   

## 2018-10-29 NOTE — Progress Notes (Signed)
Patient presents for Nexplanon Insertion  Post partum on 10/14/18 Pt denies any un protected intercourse in the last 14 days.

## 2018-11-15 IMAGING — US US MFM OB COMP +14 WKS
1 series · 13 of 28 positions shown · non-contrast
Comparison: none

[Series 1: us mfm ob comp +14 wks · 93 acquisitions, 13 frames shown]
[im 4/93]
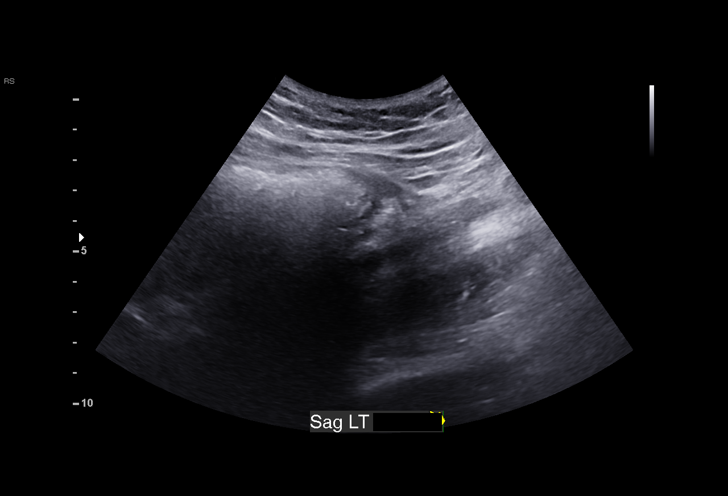
[im 11/93]
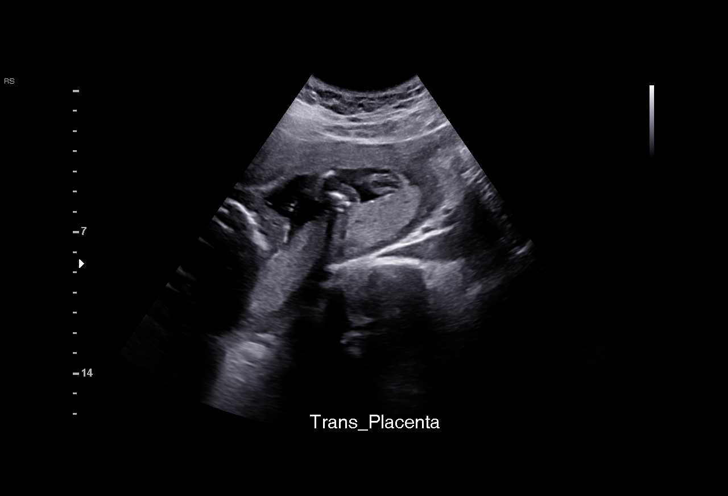
[im 18/93]
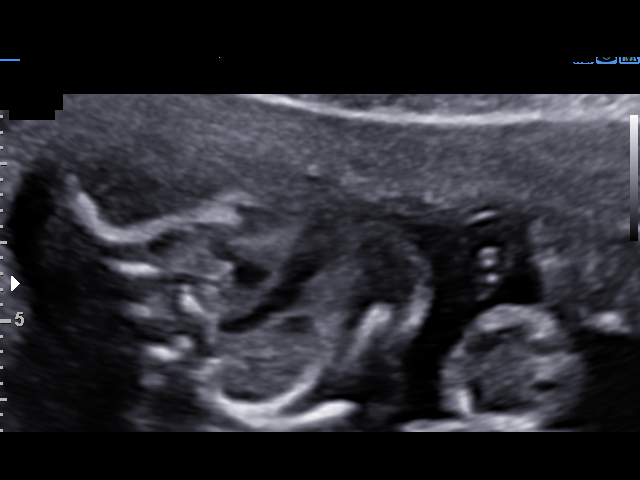
[im 24/93]
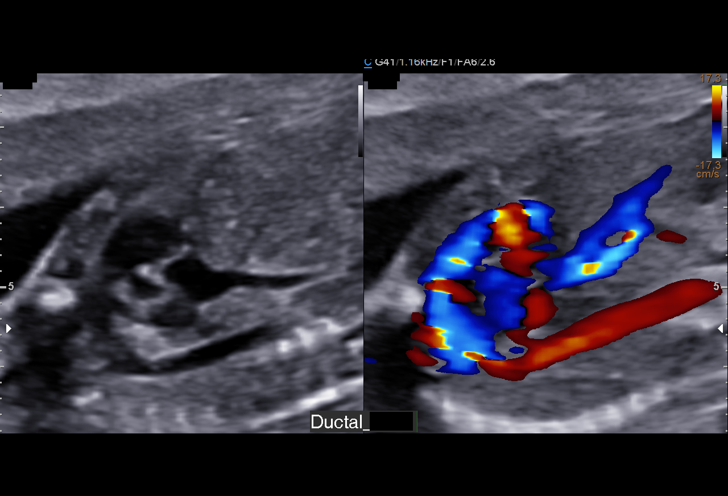
[im 31/93]
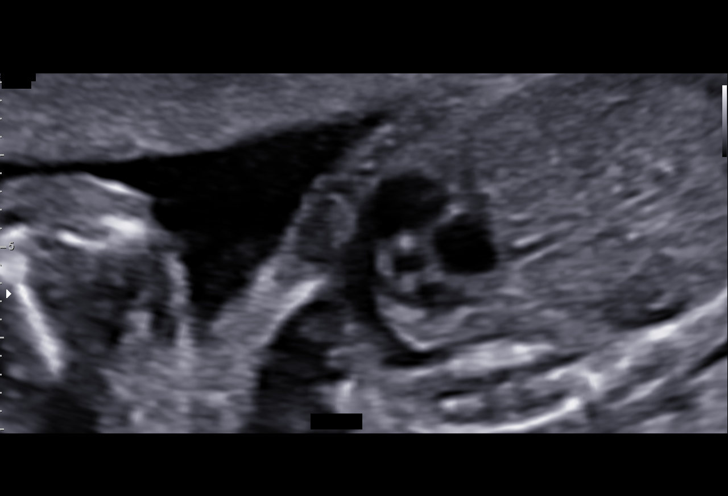
[im 38/93]
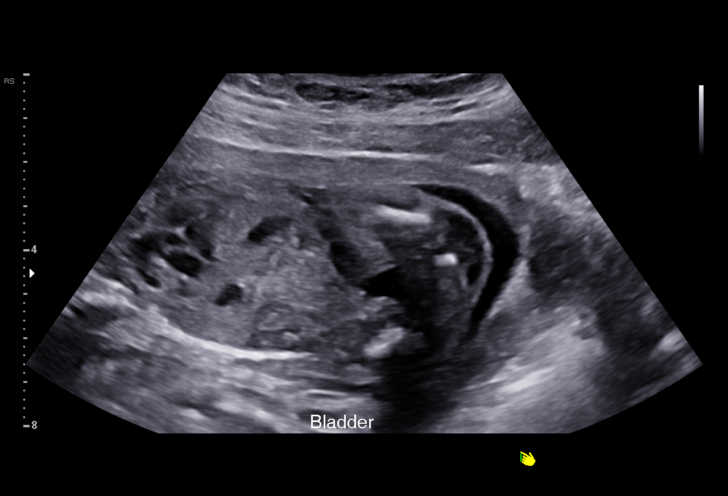
[im 48/93]
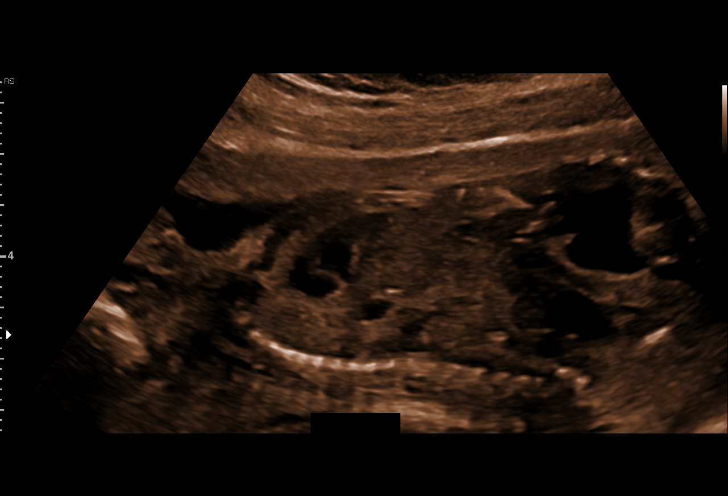
[im 55/93]
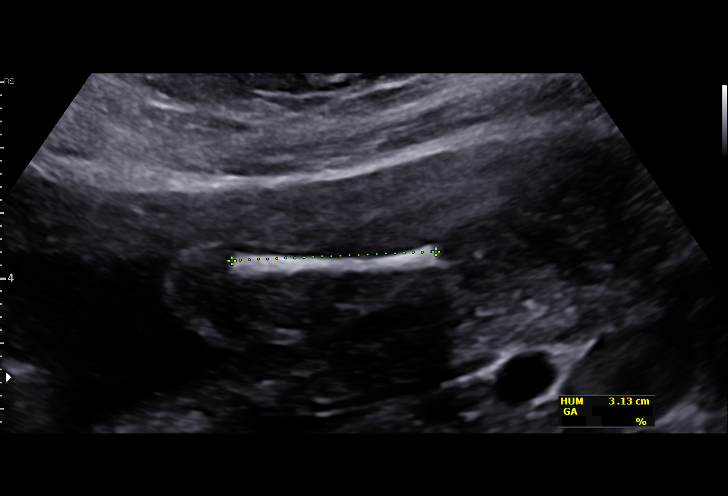
[im 62/93]
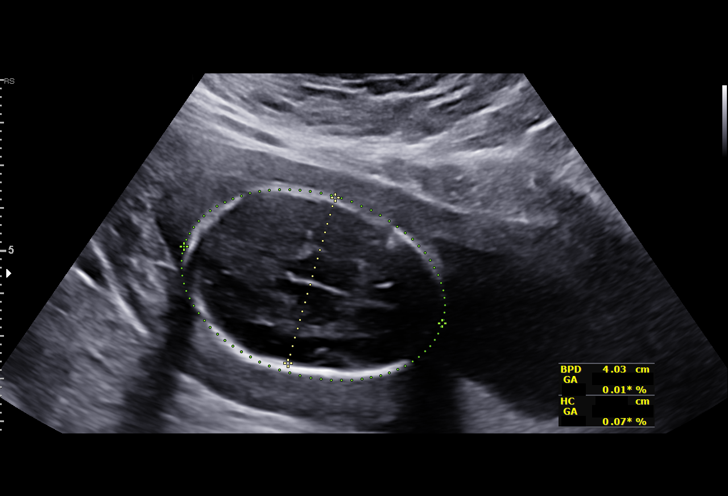
[im 69/93]
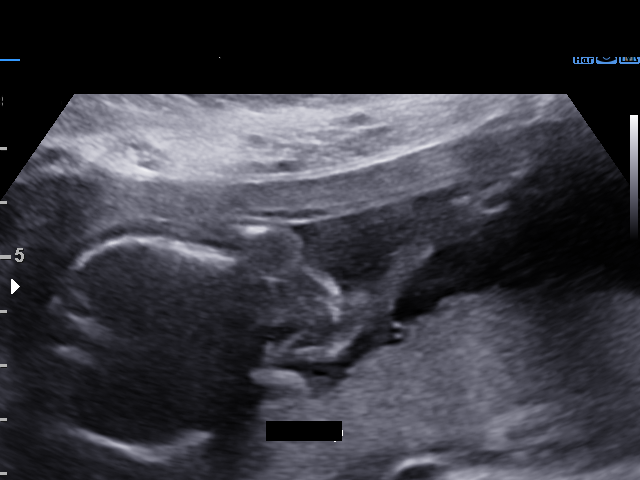
[im 75/93]
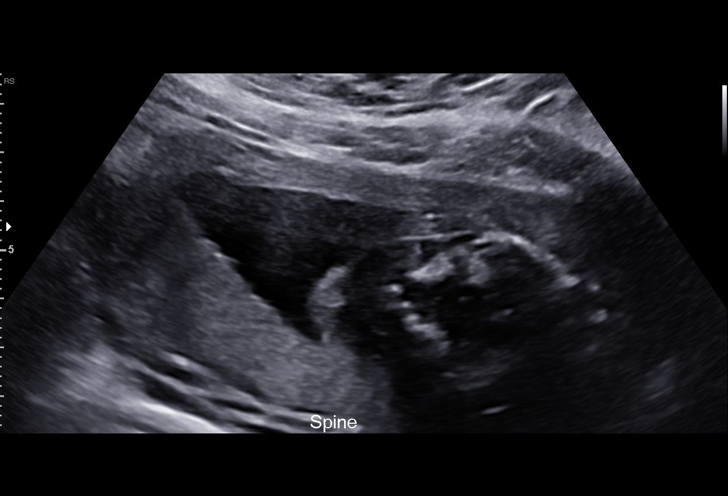
[im 82/93]
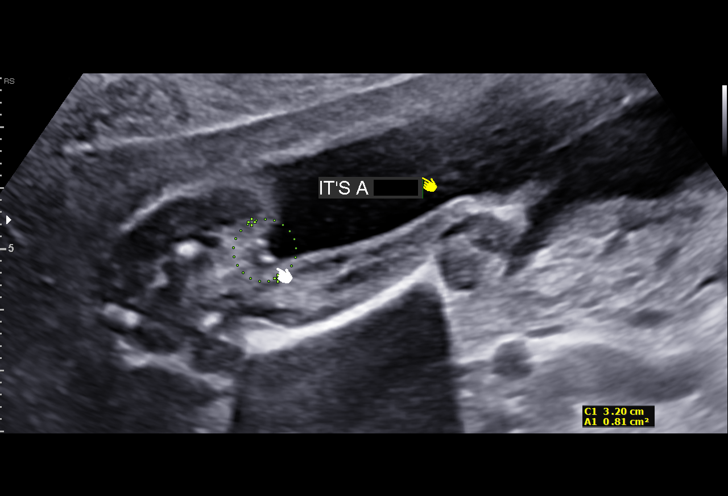
[im 89/93]
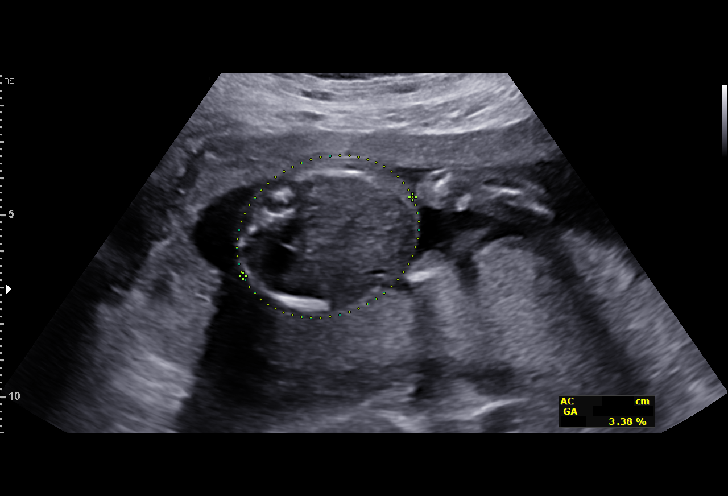

[13 of 28 positions shown; findings below may reference images not displayed]

RTOYOTA CNM

 ----------------------------------------------------------------------

 ----------------------------------------------------------------------
Indications

  Obesity complicating pregnancy, second
  trimester
  Encounter for antenatal screening for
  malformations
  22 weeks gestation of pregnancy
 ----------------------------------------------------------------------
Fetal Evaluation

 Num Of Fetuses:         1
 Fetal Heart Rate(bpm):  149
 Cardiac Activity:       Observed
 Presentation:           Breech
 Placenta:               Posterior
 P. Cord Insertion:      Visualized

 Amniotic Fluid
 AFI FV:      Within normal limits

                             Largest Pocket(cm)

Biometry

 BPD:      40.4  mm     G. Age:  18w 2d        < 1  %    CI:        60.48   %    70 - 86
                                                         FL/HC:      19.9   %    18.4 -
 HC:      168.3  mm     G. Age:  19w 4d        < 3  %    HC/AC:      1.13        1.06 -
 AC:       149   mm     G. Age:  20w 1d          4  %    FL/BPD:     82.9   %    71 - 87
 FL:       33.5  mm     G. Age:  20w 4d          6  %    FL/AC:      22.5   %    20 - 24
 HUM:      31.5  mm     G. Age:  20w 4d         11  %
 Est. FW:     336  gm    0 lb 12 oz      13  %
OB History

 Gravidity:    1
Gestational Age

 LMP:           22w 0d        Date:  12/03/17                 EDD:   09/09/18
 U/S Today:     19w 5d                                        EDD:   09/25/18
 Best:          22w 0d     Det. By:  LMP  (12/03/17)          EDD:   09/09/18
Anatomy

 Cranium:               Appears normal         Aortic Arch:            Appears normal
 Cavum:                 Not well visualized    Ductal Arch:            Appears normal
 Ventricles:            Appears normal         Diaphragm:              Not well visualized
 Choroid Plexus:        Not well visualized    Stomach:                Appears normal, left
                                                                       sided
 Cerebellum:            Not well visualized    Abdomen:                Appears normal
 Posterior Fossa:       Not well visualized    Abdominal Wall:         Not well visualized
 Nuchal Fold:           Not applicable (>20    Cord Vessels:           Appears normal (3
                        wks GA)                                        vessel cord)
 Face:                  Orbits nl; profile not Kidneys:                Appear normal
                        well visualized
 Lips:                  Previously seen        Bladder:                Appears normal
 Thoracic:              Appears normal         Spine:                  Not well visualized
 Heart:                 Echogenic focus        Upper Extremities:      Appears normal
                        in RV
 RVOT:                  Appears normal         Lower Extremities:      Appears normal
 LVOT:                  Appears normal

 Other:  Heels visualized. 3VV and 3VTV visualized. Midline falx visualized.
         Hands not well visualized. Technically difficult due to maternal habitus
         and fetal position. Fetus appears to be female.
Cervix Uterus Adnexa

 Cervix
 Length:           3.03  cm.
 Normal appearance by transabdominal scan.

 Uterus
 No abnormality visualized.

 Left Ovary
 Within normal limits.

 Right Ovary
 Not visualized.

 Adnexa
 No abnormality visualized. No adnexal mass
 visualized.
Comments

 U/S images reviewed. Findings reviewed with patient.
 Appropriate fetal growth is noted @ 13% but AC is lagging @
 4%   An echogenic intracardiac focus (EIF) is seen on today's
 U/S.  No other fetal abnormalities are seen.  Patient reports
 having a 7 week U/S which gave her an EDC of 09/20/18.
 MFM has requested this U/S report.
 General counseling was performed regarding echogenic
 intracardiac focus/foci (EIF) was performed.  EIF is seen in 3-
 4% of normal fetuses in the second trimester.  The
 prevalence appears to be significantly higher in Asian
 populations.  The likelihood ratio of EIF for Trisomy 21 is
 estimated in the range of 1.8 - 4.2 (times the age related
 risk).  Multiple or large EIF may further increase the risk of
 aneuploidy.  Panorama NIPTR is low risk.

 Questions answered.
 20 minutes spent face to face with patient.
 Recommendations: 1) Follow-up growth and completion of
 anatomy in 4 weeks
Recommendations

 1) Follow-up growth and completion of anatomy in 4 weeks

              Sadeghi, Stacee

## 2018-12-13 IMAGING — US US MFM OB FOLLOW-UP
1 series · 13 of 28 positions shown · non-contrast
Comparison: none

[Series 1: us mfm ob follow-up · 13 of 69 slices shown]
[im 3/69]
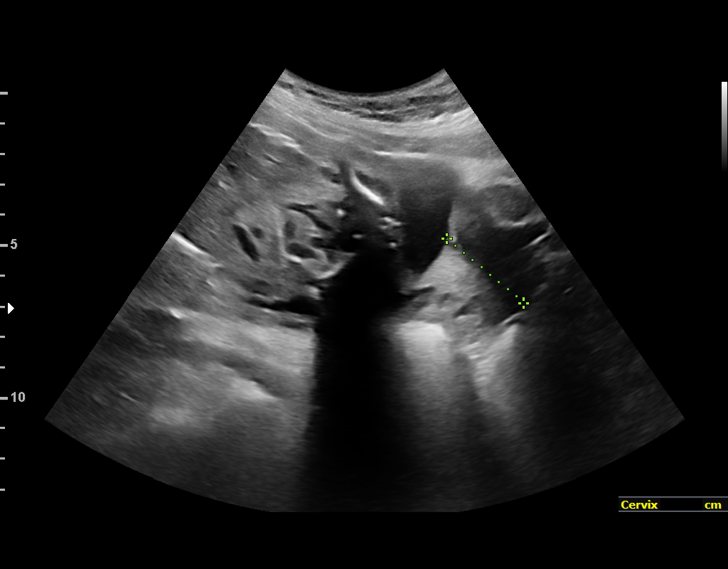
[im 8/69]
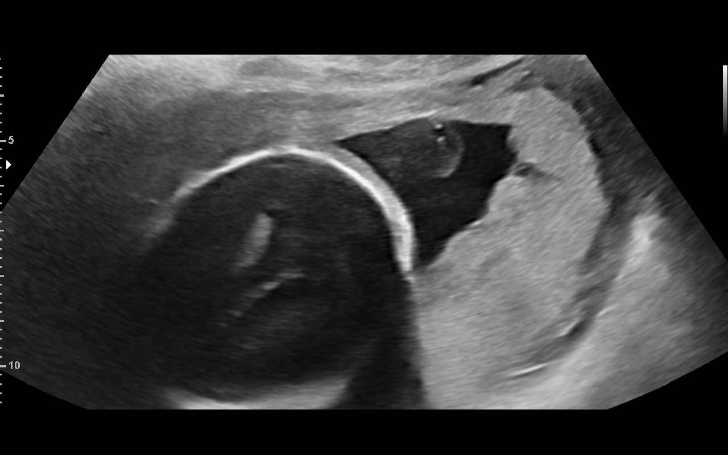
[im 13/69]
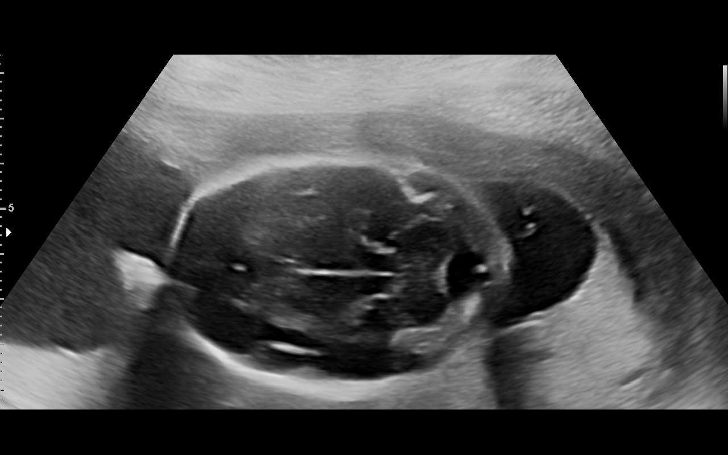
[im 18/69]
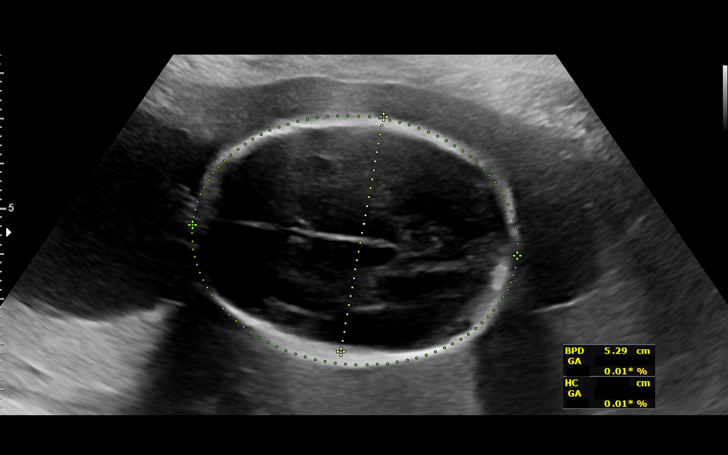
[im 23/69]
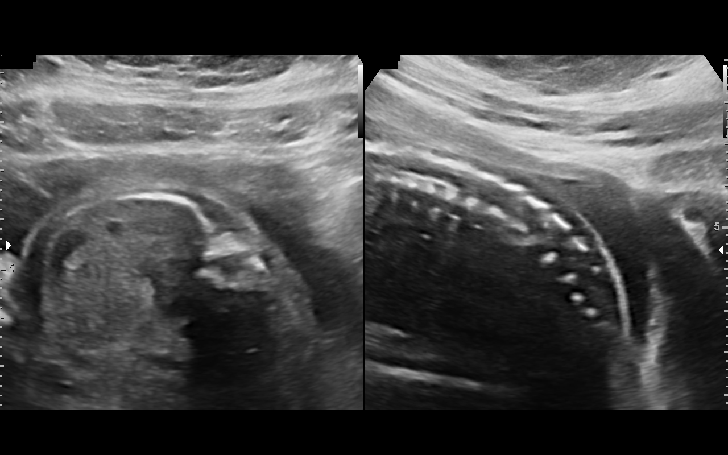
[im 28/69]
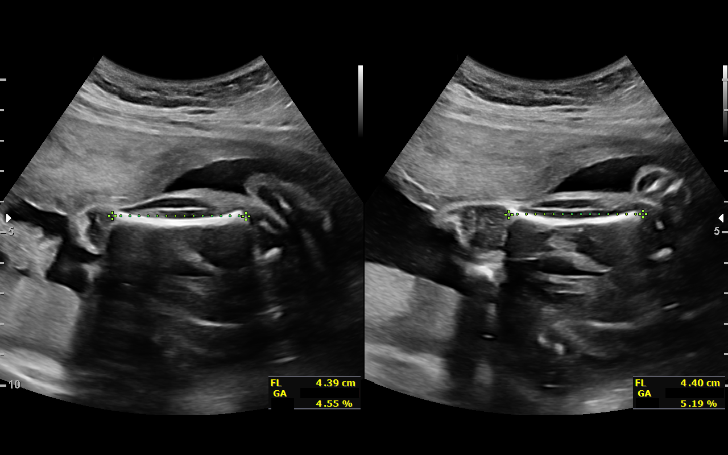
[im 36/69]
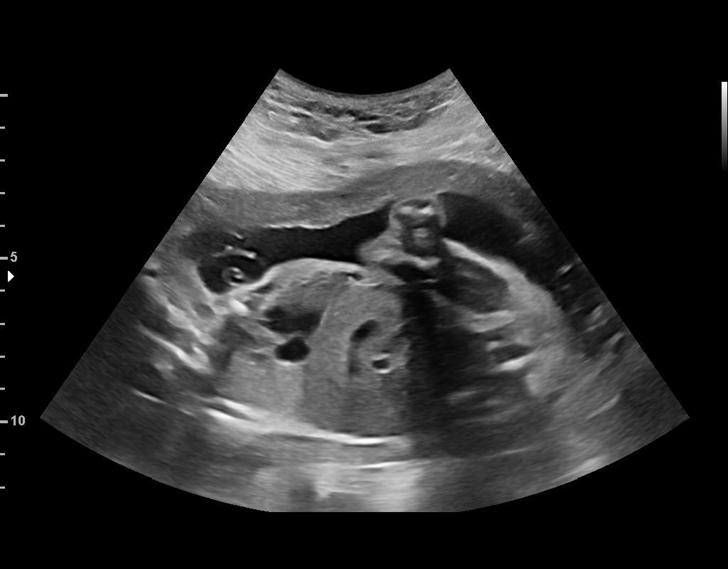
[im 41/69]
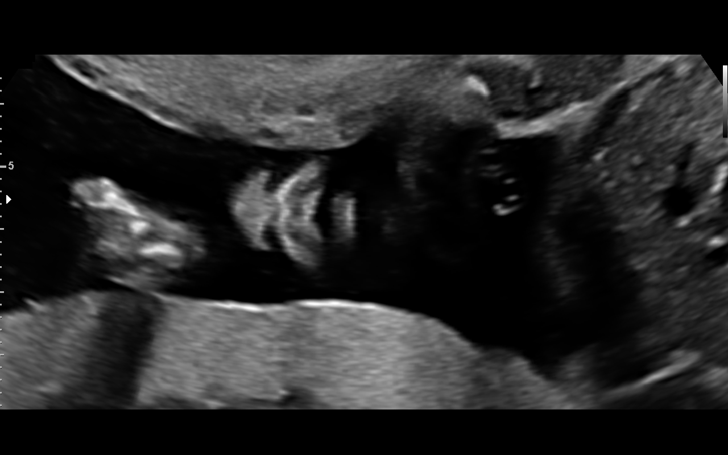
[im 46/69]
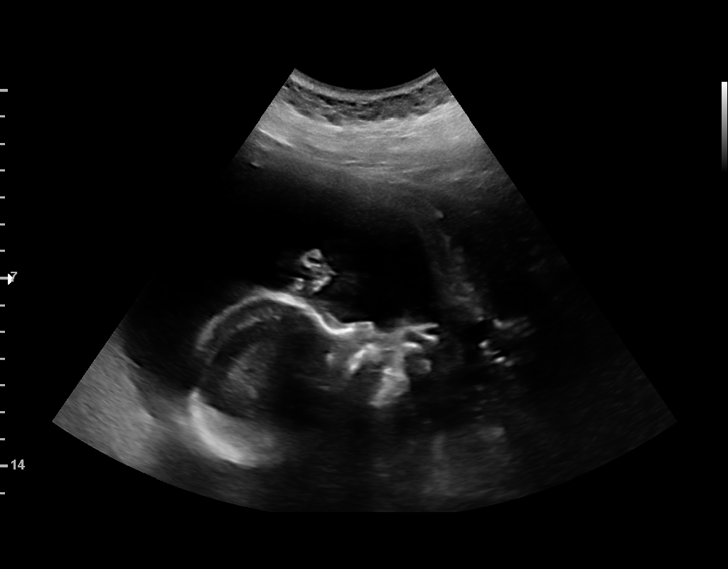
[im 51/69]
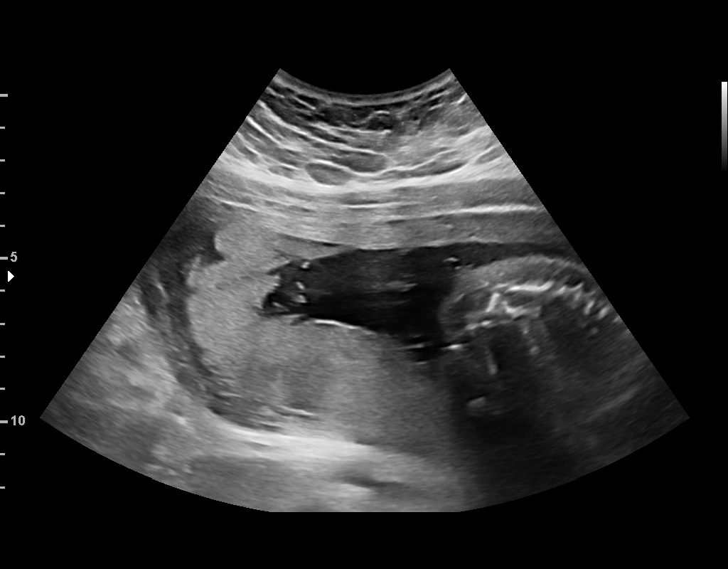
[im 56/69]
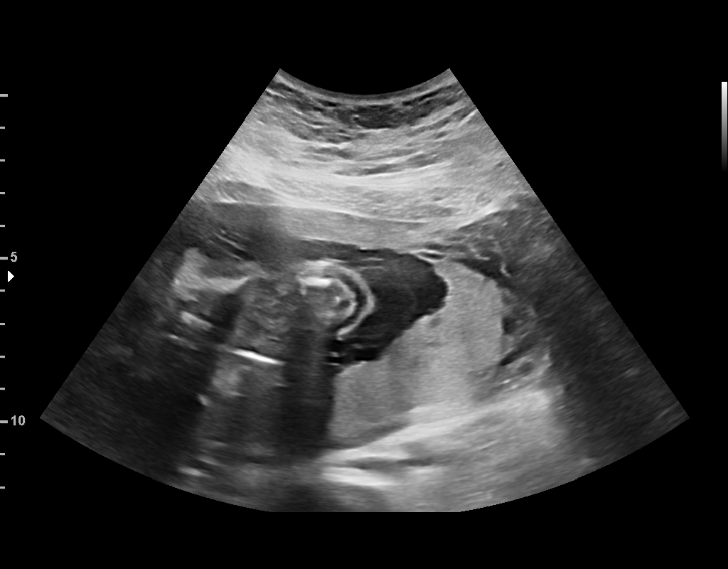
[im 61/69]
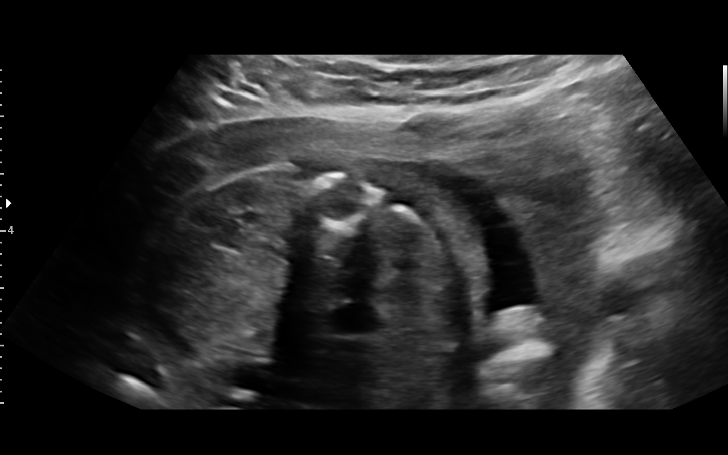
[im 66/69]
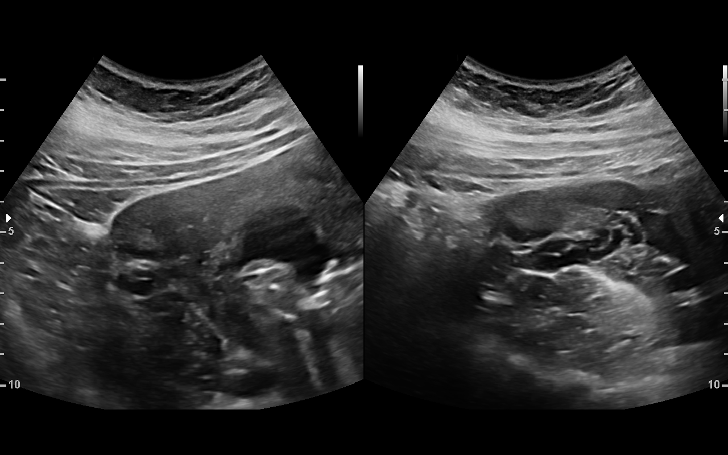

[13 of 28 positions shown; findings below may reference images not displayed]

BAU CNM

 ----------------------------------------------------------------------

 ----------------------------------------------------------------------
Indications

  Encounter for other antenatal screening
  follow-up
  24 weeks gestation of pregnancy
  Obesity complicating pregnancy, second
  trimester
 ----------------------------------------------------------------------
Vital Signs

 BMI:
Fetal Evaluation

 Num Of Fetuses:         1
 Fetal Heart Rate(bpm):  146
 Cardiac Activity:       Observed
 Presentation:           Breech
 Placenta:               Posterior
 P. Cord Insertion:      Previously Visualized

 Amniotic Fluid
 AFI FV:      Within normal limits

                             Largest Pocket(cm)

Biometry

 BPD:      53.3  mm     G. Age:  22w 1d        < 1  %    CI:        69.26   %    70 - 86
                                                         FL/HC:      21.6   %    18.7 -
 HC:      204.5  mm     G. Age:  22w 4d        < 3  %    HC/AC:      1.17        1.05 -
 AC:      175.4  mm     G. Age:  22w 3d        < 3  %    FL/BPD:     82.9   %    71 - 87
 FL:       44.2  mm     G. Age:  24w 4d         41  %    FL/AC:      25.2   %    20 - 24
 HUM:      40.3  mm     G. Age:  24w 3d         44  %
 CER:        26  mm     G. Age:  24w 0d         34  %
 LV:        2.4  mm
 CM:        7.2  mm

 Est. FW:     577  gm      1 lb 4 oz     26  %
OB History

 Gravidity:    1
Gestational Age

 LMP:           26w 0d        Date:  12/03/17                 EDD:   09/09/18
 U/S Today:     23w 0d                                        EDD:   09/30/18
 Best:          24w 3d     Det. By:  Early Ultrasound         EDD:   09/20/18
                                     (02/04/18)
Anatomy

 Cranium:               Appears normal         LVOT:                   Appears normal
 Cavum:                 Appears normal         Aortic Arch:            Previously seen
 Ventricles:            Appears normal         Ductal Arch:            Previously seen
 Choroid Plexus:        Appears normal         Diaphragm:              Appears normal
 Cerebellum:            Appears normal         Stomach:                Appears normal, left
                                                                       sided
 Posterior Fossa:       Appears normal         Abdomen:                Appears normal
 Nuchal Fold:           Not applicable (>20    Abdominal Wall:         Not well visualized
                        wks GA)
 Face:                  orbits prev seen;      Cord Vessels:           Previously seen
                        profile nl
 Lips:                  Appears normal         Kidneys:                Appear normal
 Palate:                Not well visualized    Bladder:                Appears normal
 Thoracic:              Appears normal         Spine:                  Appears normal
 Heart:                 Echogenic focus        Upper Extremities:      Previously seen
                        in RV prev seen
 RVOT:                  Previously seen        Lower Extremities:      Previously seen

 Other:  Heels, 3VV and 3VTV prev visualized. Technically difficult due to
         maternal habitus and fetal position. Fetus appears to be female.
Cervix Uterus Adnexa

 Cervix
 Length:           3.28  cm.
 Normal appearance by transabdominal scan.

 Uterus
 No abnormality visualized.

 Left Ovary
 Size(cm)       2.8  x   1.5    x  1.6       Vol(ml):
 Within normal limits.

 Right Ovary
 Size(cm)       2.4  x   1.7    x  2.5       Vol(ml):
 Within normal limits.
Comments

 U/S images reviewed. Findings reviewed with patient.
 Appropriate fetal growth is noted if EDC from our U/S on
 05/06/18 is utilized.  Patient has irregular periods occurring
 every 1-3 months.  MFM has again requested her 7 week U/S
 results from [facility].  No fetal
 abnormalities are seen.  Questions answered.
 10 minutes spent face to face with patient.
 Recommendations: 1) Completion of anatomy and follow-up
 growth  in 3 weeks
Recommendations

 1) Completion of anatomy and follow-up growth  in 3 weeks

                   Mcnutt, Jagadeesh

## 2019-01-14 IMAGING — US US MFM OB FOLLOW-UP
1 series · 14 of 28 positions shown · non-contrast
Comparison: none

[Series 1: us mfm ob follow-up · 44 acquisitions, 14 frames shown]
[im 2/44]
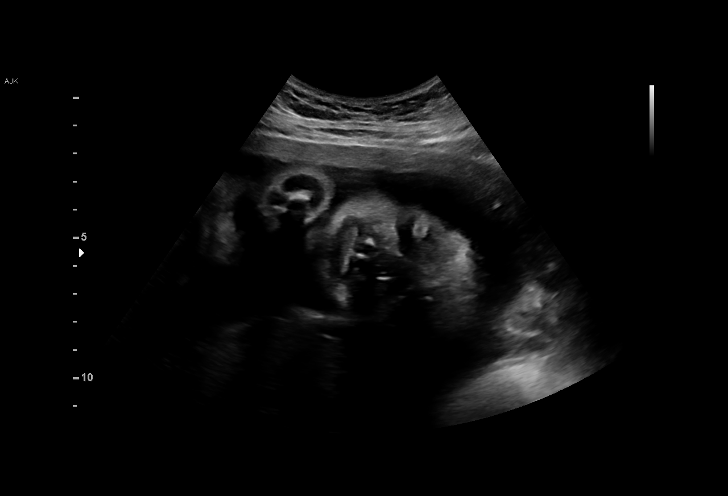
[im 5/44]
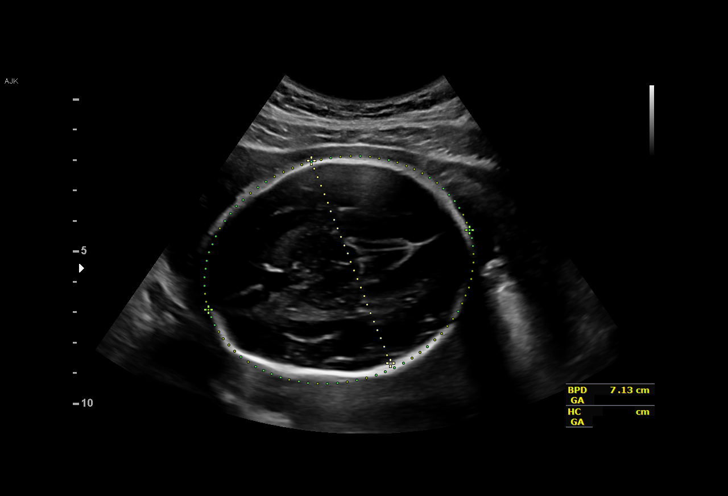
[im 8/44]
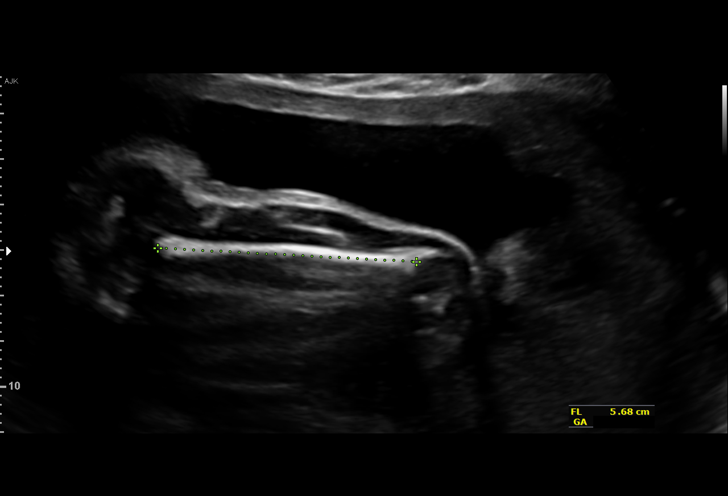
[im 12/44]
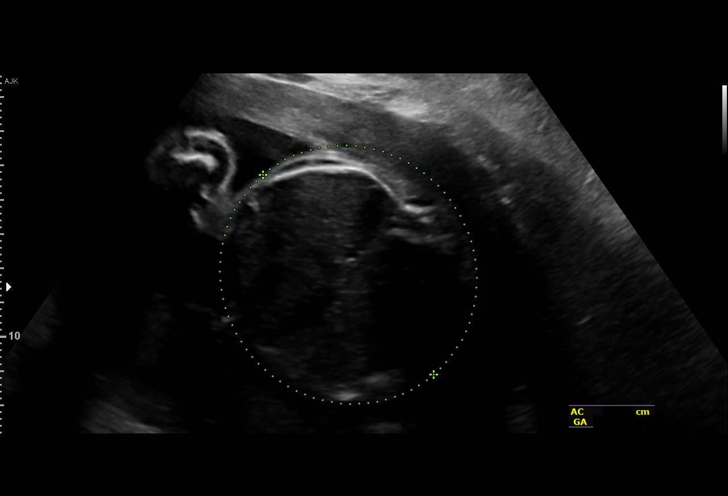
[im 15/44]
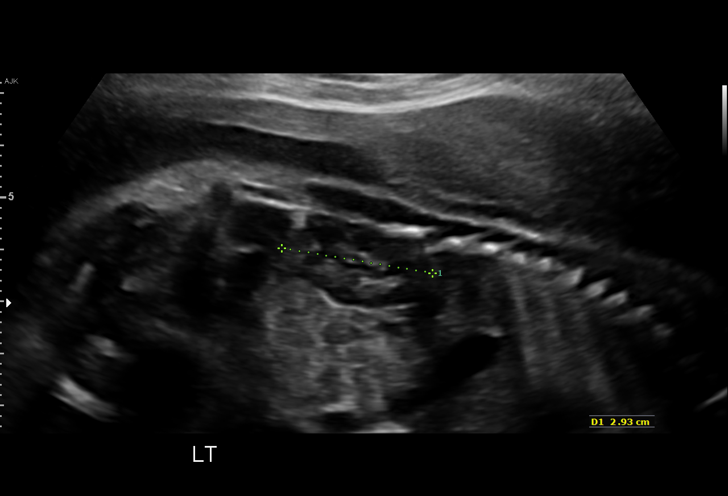
[im 18/44]
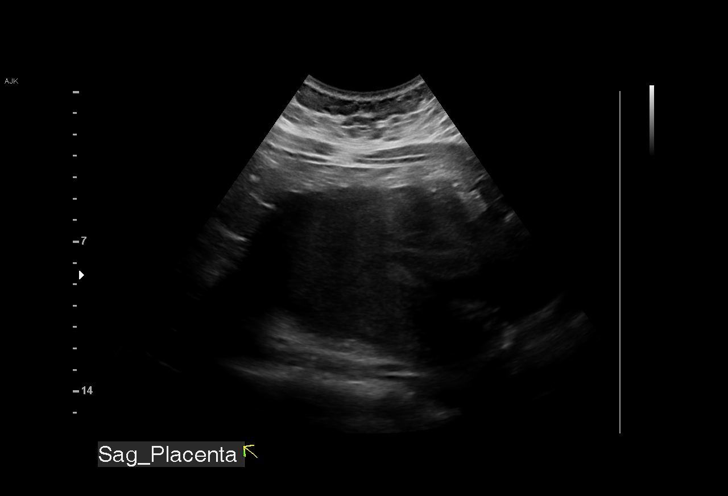
[im 21/44]
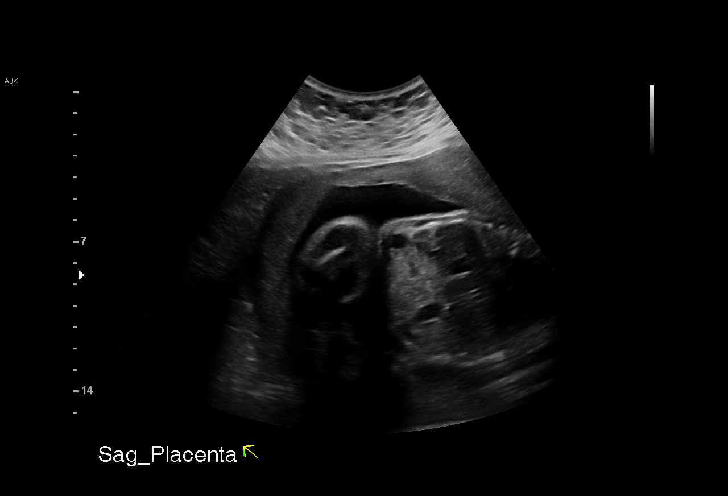
[im 24/44]
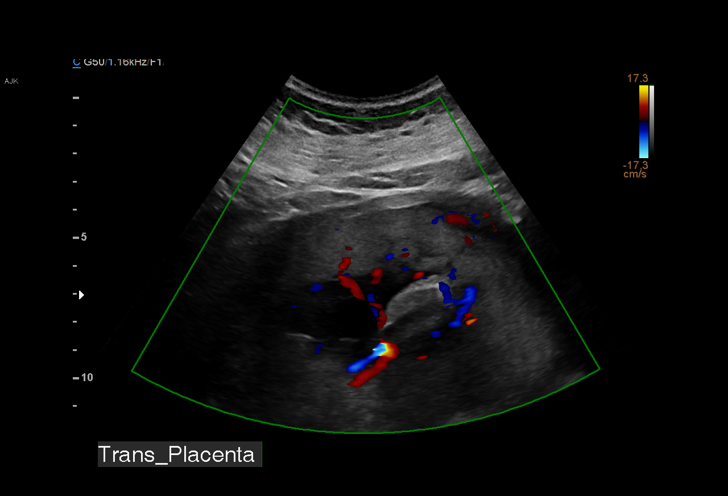
[im 28/44]
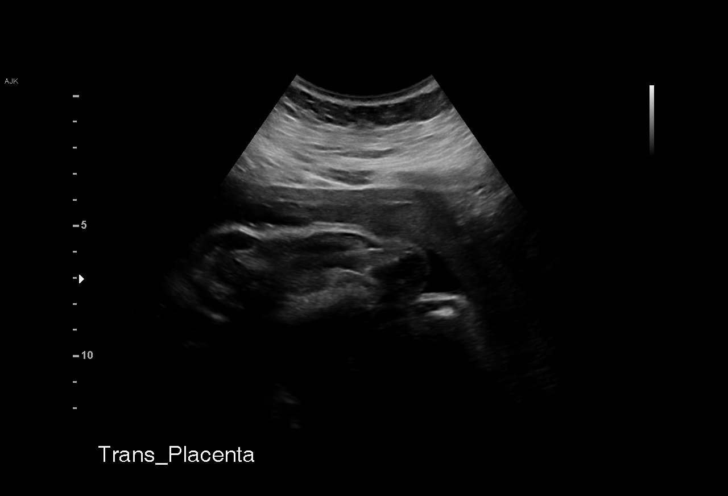
[im 31/44]
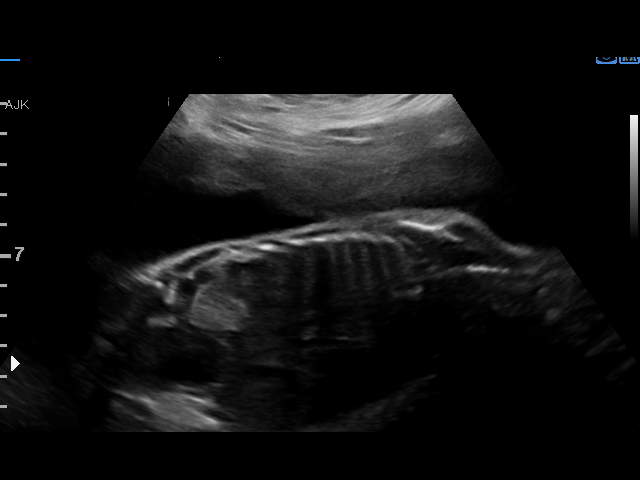
[im 34/44]
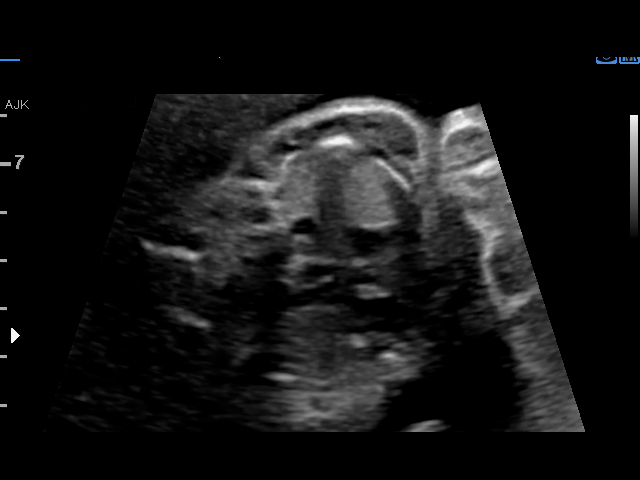
[im 37/44]
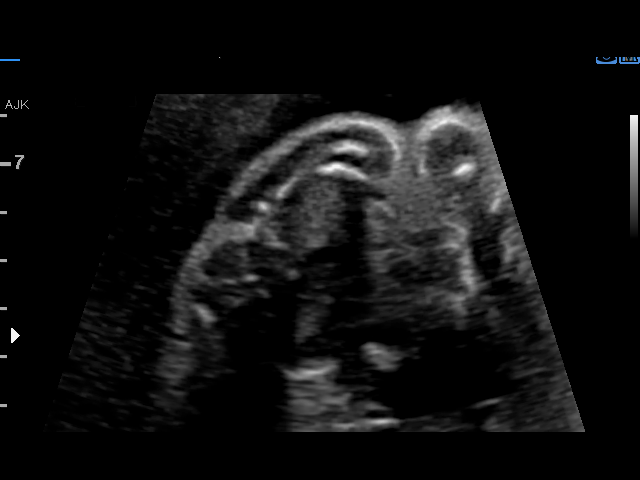
[im 40/44]
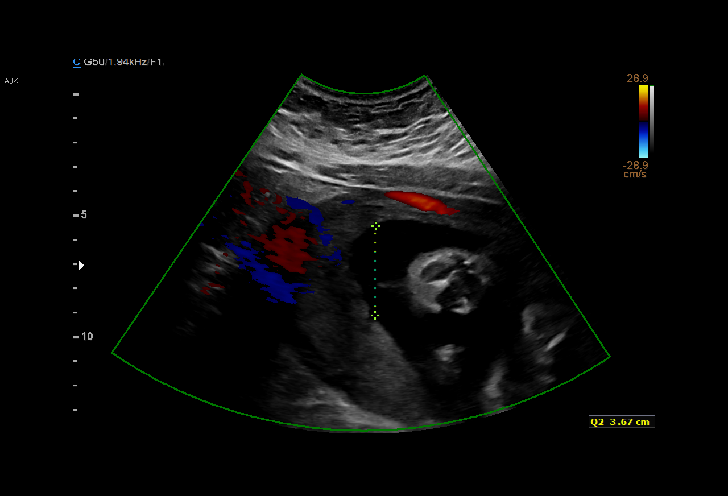
[im 44/44]
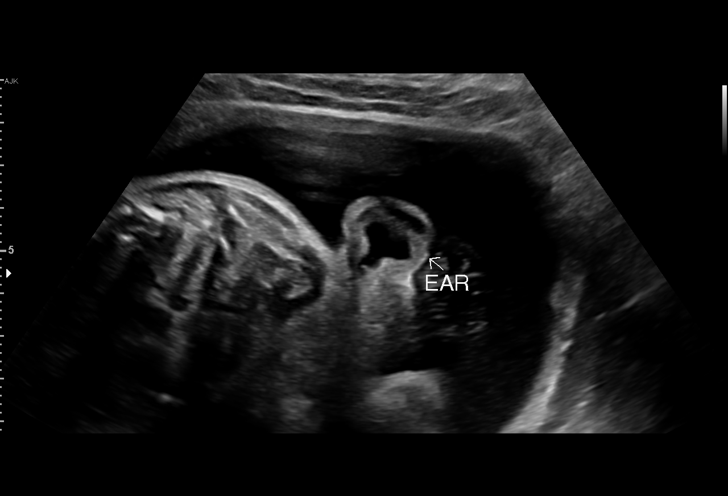

[14 of 28 positions shown; findings below may reference images not displayed]

YOUMANS CNM

 ----------------------------------------------------------------------

 ----------------------------------------------------------------------
Indications

  Obesity complicating pregnancy, third
  trimester
  Encounter for other antenatal screening
  follow-up (low risk NIps)
  29 weeks gestation of pregnancy
 ----------------------------------------------------------------------
Vital Signs

                                                Height:        5'6"
Fetal Evaluation

 Num Of Fetuses:          1
 Fetal Heart Rate(bpm):   126
 Cardiac Activity:        Observed
 Presentation:            Cephalic
 Placenta:                Posterior (lt lateral)
 P. Cord Insertion:       Visualized

 Amniotic Fluid
 AFI FV:      Within normal limits

 AFI Sum(cm)     %Tile       Largest Pocket(cm)
 18.76           72

 RUQ(cm)       RLQ(cm)       LUQ(cm)        LLQ(cm)

Biometry

 BPD:      71.1  mm     G. Age:  28w 4d         24  %    CI:          77.7  %    70 - 86
                                                         FL/HC:       22.1  %    19.6 -
 HC:      255.3  mm     G. Age:  27w 5d        < 3  %    HC/AC:       1.09       0.99 -
 AC:      234.4  mm     G. Age:  27w 5d         13  %    FL/BPD:      79.3  %    71 - 87
 FL:       56.4  mm     G. Age:  29w 5d         54  %    FL/AC:       24.1  %    20 - 24

 Est. FW:    5110   gm   2 lb 11 oz      40  %
OB History

 Gravidity:    1
Gestational Age

 LMP:           30w 4d        Date:  12/03/17                 EDD:   09/09/18
 U/S Today:     28w 3d                                        EDD:   09/24/18
 Best:          29w 0d     Det. By:  Early Ultrasound         EDD:   09/20/18
                                     (02/04/18)
Anatomy

 Cranium:               Appears normal         LVOT:                   Previously seen
 Cavum:                 Appears normal         Aortic Arch:            Appears normal
 Ventricles:            Appears normal         Ductal Arch:            Previously seen
 Choroid Plexus:        Previously seen        Diaphragm:              Previously seen
 Cerebellum:            Previously seen        Stomach:                Appears normal, left
                                                                       sided
 Posterior Fossa:       Previously seen        Abdomen:                Appears normal
 Nuchal Fold:           Not applicable (>20    Abdominal Wall:         Not well visualized
                        wks GA)
 Face:                  Orbits and profile     Cord Vessels:           Previously seen
                        previously seen
 Lips:                  Previously seen        Kidneys:                Appear normal
 Palate:                Not well visualized    Bladder:                Appears normal
 Thoracic:              Appears normal         Spine:                  Previously seen
 Heart:                 Echogenic focus        Upper Extremities:      Previously seen
                        in RV prev seen
 RVOT:                  Previously seen        Lower Extremities:      Previously seen

 Other:  Heels, 3VV and 3VTV prev visualized. Technically difficult due to
         maternal habitus and fetal position. Fetus appears to be female.
Impression

 Normal interval growth.
Recommendations

 Follow up scheduled in 4 weeks given the indication of BMI
 40.

## 2020-11-30 ENCOUNTER — Ambulatory Visit: Payer: Medicaid Other | Admitting: Internal Medicine

## 2020-11-30 ENCOUNTER — Encounter: Payer: Self-pay | Admitting: Internal Medicine

## 2020-11-30 ENCOUNTER — Other Ambulatory Visit: Payer: Self-pay

## 2020-11-30 VITALS — BP 110/80 | HR 74 | Temp 98.1°F | Ht 66.0 in | Wt 260.9 lb

## 2020-11-30 DIAGNOSIS — Z0000 Encounter for general adult medical examination without abnormal findings: Secondary | ICD-10-CM

## 2020-11-30 DIAGNOSIS — Z124 Encounter for screening for malignant neoplasm of cervix: Secondary | ICD-10-CM

## 2020-11-30 LAB — CBC WITH DIFFERENTIAL/PLATELET
Absolute Monocytes: 639 cells/uL (ref 200–950)
Basophils Absolute: 52 cells/uL (ref 0–200)
Basophils Relative: 0.5 %
Eosinophils Absolute: 391 cells/uL (ref 15–500)
Eosinophils Relative: 3.8 %
HCT: 43.4 % (ref 35.0–45.0)
Hemoglobin: 14 g/dL (ref 11.7–15.5)
Lymphs Abs: 3523 cells/uL (ref 850–3900)
MCH: 25.1 pg — ABNORMAL LOW (ref 27.0–33.0)
MCHC: 32.3 g/dL (ref 32.0–36.0)
MCV: 77.8 fL — ABNORMAL LOW (ref 80.0–100.0)
MPV: 10.5 fL (ref 7.5–12.5)
Monocytes Relative: 6.2 %
Neutro Abs: 5696 cells/uL (ref 1500–7800)
Neutrophils Relative %: 55.3 %
Platelets: 319 10*3/uL (ref 140–400)
RBC: 5.58 10*6/uL — ABNORMAL HIGH (ref 3.80–5.10)
RDW: 14.1 % (ref 11.0–15.0)
Total Lymphocyte: 34.2 %
WBC: 10.3 10*3/uL (ref 3.8–10.8)

## 2020-11-30 LAB — EXTRA SPECIMEN

## 2020-11-30 NOTE — Patient Instructions (Signed)
-Nice seeing you today!!  -Lab work today; will notify you once results are available.  -remember your COVID booster.  -Schedule follow up in 6 months or sooner as needed.   Calorie Counting for Weight Loss Calories are units of energy. Your body needs a certain number of calories from food to keep going throughout the day. When you eat or drink more calories than your body needs, your body stores the extra calories mostly as fat. When you eat or drink fewer calories than your body needs, your body burns fat to get the energy it needs. Calorie counting means keeping track of how many calories you eat and drink each day. Calorie counting can be helpful if you need to lose weight. If you eat fewer calories than your body needs, you should lose weight. Ask your health care provider what a healthy weight is for you. For calorie counting to work, you will need to eat the right number of calories each day to lose a healthy amount of weight per week. A dietitian can help you figure out how many calories you need in a day and will suggest ways to reach your calorie goal.  A healthy amount of weight to lose each week is usually 1-2 lb (0.5-0.9 kg). This usually means that your daily calorie intake should be reduced by 500-750 calories.  Eating 1,200-1,500 calories a day can help most women lose weight.  Eating 1,500-1,800 calories a day can help most men lose weight. What do I need to know about calorie counting? Work with your health care provider or dietitian to determine how many calories you should get each day. To meet your daily calorie goal, you will need to:  Find out how many calories are in each food that you would like to eat. Try to do this before you eat.  Decide how much of the food you plan to eat.  Keep a food log. Do this by writing down what you ate and how many calories it had. To successfully lose weight, it is important to balance calorie counting with a healthy lifestyle that  includes regular activity. Where do I find calorie information? The number of calories in a food can be found on a Nutrition Facts label. If a food does not have a Nutrition Facts label, try to look up the calories online or ask your dietitian for help. Remember that calories are listed per serving. If you choose to have more than one serving of a food, you will have to multiply the calories per serving by the number of servings you plan to eat. For example, the label on a package of bread might say that a serving size is 1 slice and that there are 90 calories in a serving. If you eat 1 slice, you will have eaten 90 calories. If you eat 2 slices, you will have eaten 180 calories.   How do I keep a food log? After each time that you eat, record the following in your food log as soon as possible:  What you ate. Be sure to include toppings, sauces, and other extras on the food.  How much you ate. This can be measured in cups, ounces, or number of items.  How many calories were in each food and drink.  The total number of calories in the food you ate. Keep your food log near you, such as in a pocket-sized notebook or on an app or website on your mobile phone. Some programs will calculate  calories for you and show you how many calories you have left to meet your daily goal. What are some portion-control tips?  Know how many calories are in a serving. This will help you know how many servings you can have of a certain food.  Use a measuring cup to measure serving sizes. You could also try weighing out portions on a kitchen scale. With time, you will be able to estimate serving sizes for some foods.  Take time to put servings of different foods on your favorite plates or in your favorite bowls and cups so you know what a serving looks like.  Try not to eat straight from a food's packaging, such as from a bag or box. Eating straight from the package makes it hard to see how much you are eating and can  lead to overeating. Put the amount you would like to eat in a cup or on a plate to make sure you are eating the right portion.  Use smaller plates, glasses, and bowls for smaller portions and to prevent overeating.  Try not to multitask. For example, avoid watching TV or using your computer while eating. If it is time to eat, sit down at a table and enjoy your food. This will help you recognize when you are full. It will also help you be more mindful of what and how much you are eating. What are tips for following this plan? Reading food labels  Check the calorie count compared with the serving size. The serving size may be smaller than what you are used to eating.  Check the source of the calories. Try to choose foods that are high in protein, fiber, and vitamins, and low in saturated fat, trans fat, and sodium. Shopping  Read nutrition labels while you shop. This will help you make healthy decisions about which foods to buy.  Pay attention to nutrition labels for low-fat or fat-free foods. These foods sometimes have the same number of calories or more calories than the full-fat versions. They also often have added sugar, starch, or salt to make up for flavor that was removed with the fat.  Make a grocery list of lower-calorie foods and stick to it. Cooking  Try to cook your favorite foods in a healthier way. For example, try baking instead of frying.  Use low-fat dairy products. Meal planning  Use more fruits and vegetables. One-half of your plate should be fruits and vegetables.  Include lean proteins, such as chicken, [place], and fish. Lifestyle Each week, aim to do one of the following:  150 minutes of moderate exercise, such as walking.  75 minutes of vigorous exercise, such as running. General information  Know how many calories are in the foods you eat most often. This will help you calculate calorie counts faster.  Find a way of tracking calories that works for you. Get  creative. Try different apps or programs if writing down calories does not work for you. What foods should I eat?  Eat nutritious foods. It is better to have a nutritious, high-calorie food, such as an avocado, than a food with few nutrients, such as a bag of potato chips.  Use your calories on foods and drinks that will fill you up and will not leave you hungry soon after eating. ? Examples of foods that fill you up are nuts and nut butters, vegetables, lean proteins, and high-fiber foods such as whole grains. High-fiber foods are foods with more than 5 g of fiber  per serving.  Pay attention to calories in drinks. Low-calorie drinks include water and unsweetened drinks. The items listed above may not be a complete list of foods and beverages you can eat. Contact a dietitian for more information.   What foods should I limit? Limit foods or drinks that are not good sources of vitamins, minerals, or protein or that are high in unhealthy fats. These include:  Candy.  Other sweets.  Sodas, specialty coffee drinks, alcohol, and juice. The items listed above may not be a complete list of foods and beverages you should avoid. Contact a dietitian for more information. How do I count calories when eating out?  Pay attention to portions. Often, portions are much larger when eating out. Try these tips to keep portions smaller: ? Consider sharing a meal instead of getting your own. ? If you get your own meal, eat only half of it. Before you start eating, ask for a container and put half of your meal into it. ? When available, consider ordering smaller portions from the menu instead of full portions.  Pay attention to your food and drink choices. Knowing the way food is cooked and what is included with the meal can help you eat fewer calories. ? If calories are listed on the menu, choose the lower-calorie options. ? Choose dishes that include vegetables, fruits, whole grains, low-fat dairy products,  and lean proteins. ? Choose items that are boiled, broiled, grilled, or steamed. Avoid items that are buttered, battered, fried, or served with cream sauce. Items labeled as crispy are usually fried, unless stated otherwise. ? Choose water, low-fat milk, unsweetened iced tea, or other drinks without added sugar. If you want an alcoholic beverage, choose a lower-calorie option, such as a glass of wine or light beer. ? Ask for dressings, sauces, and syrups on the side. These are usually high in calories, so you should limit the amount you eat. ? If you want a salad, choose a garden salad and ask for grilled meats. Avoid extra toppings such as bacon, cheese, or fried items. Ask for the dressing on the side, or ask for olive oil and vinegar or lemon to use as dressing.  Estimate how many servings of a food you are given. Knowing serving sizes will help you be aware of how much food you are eating at restaurants. Where to find more information  Centers for Disease Control and Prevention: http://www.wolf.info/  [place] Department of Agriculture: http://www.wilson-mendoza.org/ Summary  Calorie counting means keeping track of how many calories you eat and drink each day. If you eat fewer calories than your body needs, you should lose weight.  A healthy amount of weight to lose per week is usually 1-2 lb (0.5-0.9 kg). This usually means reducing your daily calorie intake by 500-750 calories.  The number of calories in a food can be found on a Nutrition Facts label. If a food does not have a Nutrition Facts label, try to look up the calories online or ask your dietitian for help.  Use smaller plates, glasses, and bowls for smaller portions and to prevent overeating.  Use your calories on foods and drinks that will fill you up and not leave you hungry shortly after a meal. This information is not intended to replace advice given to you by your health care provider. Make sure you discuss any questions you have with your health care  provider. Document Revised: 07/28/2019 Document Reviewed: 07/28/2019 Elsevier Patient Education  2021 [place]  53-75 Years Old, Female Preventive care refers to lifestyle choices and visits with your health care provider that can promote health and wellness. This includes:  A yearly physical exam. This is also called an annual wellness visit.  Regular dental and eye exams.  Immunizations.  Screening for certain conditions.  Healthy lifestyle choices, such as: ? Eating a healthy diet. ? Getting regular exercise. ? Not using drugs or products that contain nicotine and tobacco. ? Limiting alcohol use. What can I expect for my preventive care visit? Physical exam Your health care provider may check your:  Height and weight. These may be used to calculate your BMI (body mass index). BMI is a measurement that tells if you are at a healthy weight.  Heart rate and blood pressure.  Body temperature.  Skin for abnormal spots. Counseling Your health care provider may ask you questions about your:  Past medical problems.  Family's medical history.  Alcohol, tobacco, and drug use.  Emotional well-being.  Home life and relationship well-being.  Sexual activity.  Diet, exercise, and sleep habits.  Work and work Statistician.  Access to firearms.  Method of birth control.  Menstrual cycle.  Pregnancy history. What immunizations do I need? Vaccines are usually given at various ages, according to a schedule. Your health care provider will recommend vaccines for you based on your age, medical history, and lifestyle or other factors, such as travel or where you work.   What tests do I need? Blood tests  Lipid and cholesterol levels. These may be checked every 5 years starting at age 69.  Hepatitis C test.  Hepatitis B test. Screening  Diabetes screening. This is done by checking your blood sugar (glucose) after you have not eaten for a while  (fasting).  STD (sexually transmitted disease) testing, if you are at risk.  BRCA-related cancer screening. This may be done if you have a family history of breast, ovarian, tubal, or peritoneal cancers.  Pelvic exam and Pap test. This may be done every 3 years starting at age 80. Starting at age 38, this may be done every 5 years if you have a Pap test in combination with an HPV test. Talk with your health care provider about your test results, treatment options, and if necessary, the need for more tests.   Follow these instructions at home: Eating and drinking  Eat a healthy diet that includes fresh fruits and vegetables, whole grains, lean protein, and low-fat dairy products.  Take vitamin and mineral supplements as recommended by your health care provider.  Do not drink alcohol if: ? Your health care provider tells you not to drink. ? You are pregnant, may be pregnant, or are planning to become pregnant.  If you drink alcohol: ? Limit how much you have to 0-1 drink a day. ? Be aware of how much alcohol is in your drink. In the [place] one drink equals one 12 oz bottle of beer (355 mL), one 5 oz glass of wine (148 mL), or one 1 oz glass of hard liquor (44 mL).   Lifestyle  Take daily care of your teeth and gums. Brush your teeth every morning and night with fluoride toothpaste. Floss one time each day.  Stay active. Exercise for at least 30 minutes 5 or more days each week.  Do not use any products that contain nicotine or tobacco, such as cigarettes, e-cigarettes, and chewing tobacco. If you need help quitting, ask your health care provider.  Do not use drugs.  If you are sexually active, practice safe sex. Use a condom or other form of protection to prevent STIs (sexually transmitted infections).  If you do not wish to become pregnant, use a form of birth control. If you plan to become pregnant, see your health care provider for a prepregnancy visit.  Find healthy ways to cope  with stress, such as: ? Meditation, yoga, or listening to music. ? Journaling. ? Talking to a trusted person. ? Spending time with friends and family. Safety  Always wear your seat belt while driving or riding in a vehicle.  Do not drive: ? If you have been drinking alcohol. Do not ride with someone who has been drinking. ? When you are tired or distracted. ? While texting.  Wear a helmet and other protective equipment during sports activities.  If you have firearms in your house, make sure you follow all gun safety procedures.  Seek help if you have been physically or sexually abused. What's next?  Go to your health care provider once a year for an annual wellness visit.  Ask your health care provider how often you should have your eyes and teeth checked.  Stay up to date on all vaccines. This information is not intended to replace advice given to you by your health care provider. Make sure you discuss any questions you have with your health care provider. Document Revised: 02/12/2020 Document Reviewed: 02/25/2018 Elsevier Patient Education  2021 [employer].

## 2020-11-30 NOTE — Progress Notes (Signed)
New Patient Office Visit     This visit occurred during the SARS-CoV-2 public health emergency.  Safety protocols were in place, including screening questions prior to the visit, additional usage of staff PPE, and extensive cleaning of exam room while observing appropriate contact time as indicated for disinfecting solutions.    CC/Reason for Visit: Establish care, annual preventive exam Previous PCP: None Last Visit: Unknown  HPI: Veronica Hensley is a 30 y.o. female who is coming in today for the above mentioned reasons. Past Medical History is significant for: Morbid obesity.  She is a stay-at-home mother, she used to be a nail tech, she has no significant past medical or surgical history, she takes no daily medications.  She has no known drug allergies.  She drinks occasionally, she does smoke about 3 cigarettes a day.  Her family history significant for a father with a prior stroke, diabetes and hypertension and a mother with hypertension.  She has had 2 COVID vaccines, her Tdap is updated.  She is overdue for eye and dental exams.  She is overdue for Pap smear.  She has a birth control implant that she would like removed.   Past Medical/Surgical History: Past Medical History:  Diagnosis Date  . Medical history non-contributory     Past Surgical History:  Procedure Laterality Date  . NO PAST SURGERIES      Social History:  reports that she has never smoked. She has never used smokeless tobacco. She reports previous alcohol use. She reports that she does not use drugs.  Allergies: No Known Allergies  Family History:  Family History  Problem Relation Age of Onset  . Hypertension Mother   . Hypertension Father   . Diabetes Father   . Stroke Father     No current outpatient medications on file.  Review of Systems:  Constitutional: Denies fever, chills, diaphoresis, appetite change and fatigue.  HEENT: Denies photophobia, eye pain, redness, hearing loss, ear pain,  congestion, sore throat, rhinorrhea, sneezing, mouth sores, trouble swallowing, neck pain, neck stiffness and tinnitus.   Respiratory: Denies SOB, DOE, cough, chest tightness,  and wheezing.   Cardiovascular: Denies chest pain, palpitations and leg swelling.  Gastrointestinal: Denies nausea, vomiting, abdominal pain, diarrhea, constipation, blood in stool and abdominal distention.  Genitourinary: Denies dysuria, urgency, frequency, hematuria, flank pain and difficulty urinating.  Endocrine: Denies: hot or cold intolerance, sweats, changes in hair or nails, polyuria, polydipsia. Musculoskeletal: Denies myalgias, back pain, joint swelling, arthralgias and gait problem.  Skin: Denies pallor, rash and wound.  Neurological: Denies dizziness, seizures, syncope, weakness, light-headedness, numbness and headaches.  Hematological: Denies adenopathy. Easy bruising, personal or family bleeding history  Psychiatric/Behavioral: Denies suicidal ideation, mood changes, confusion, nervousness, sleep disturbance and agitation    Physical Exam: Vitals:   11/30/20 1608  BP: 110/80  Pulse: 74  Temp: 98.1 F (36.7 C)  TempSrc: Oral  SpO2: 98%  Weight: 260 lb 14.4 oz (118.3 kg)  Height: 5\' 6"  (1.676 m)   Body mass index is 42.11 kg/m.  Constitutional: NAD, calm, comfortable, obese Eyes: PERRL, lids and conjunctivae normal ENMT: Mucous membranes are moist. Posterior pharynx clear of any exudate or lesions. Normal dentition. Tympanic membrane is pearly white, no erythema or bulging. Neck: normal, supple, no masses, no thyromegaly Respiratory: clear to auscultation bilaterally, no wheezing, no crackles. Normal respiratory effort. No accessory muscle use.  Cardiovascular: Regular rate and rhythm, no murmurs / rubs / gallops. No extremity edema. 2+ pedal pulses. No carotid  bruits.  Abdomen: no tenderness, no masses palpated. No hepatosplenomegaly. Bowel sounds positive.  Musculoskeletal: no clubbing /  cyanosis. No joint deformity upper and lower extremities. Good ROM, no contractures. Normal muscle tone.  Skin: no rashes, lesions, ulcers. No induration Neurologic: CN 2-12 grossly intact. Sensation intact, DTR normal. Strength 5/5 in all 4.  Psychiatric: Normal judgment and insight. Alert and oriented x 3. Normal mood.    Impression and Plan:  Encounter for preventive health examination  - Plan: CBC with Differential/Platelet, Comprehensive metabolic panel, Hemoglobin A1c, Lipid panel, TSH, Vitamin B12, VITAMIN D 25 Hydroxy (Vit-D Deficiency, Fractures) -Advised routine eye and dental care. -Advise she get her COVID booster. -Healthy lifestyle has been discussed in detail. -Refer to GYN for cervical cancer screening as she would like her birth control implant removed and we do not perform this in the office. -Commence routine breast cancer screening age 89 and colon cancer screening age 36.  Screening for cervical cancer  - Plan: Ambulatory referral to Gynecology -Also for birth implant removal.  Morbid obesity ([place]) -Discussed healthy lifestyle, including increased physical activity and better food choices to promote weight loss.     Patient Instructions   -Nice seeing you today!!  -Lab work today; will notify you once results are available.  -remember your COVID booster.  -Schedule follow up in 6 months or sooner as needed.   Calorie Counting for Weight Loss Calories are units of energy. Your body needs a certain number of calories from food to keep going throughout the day. When you eat or drink more calories than your body needs, your body stores the extra calories mostly as fat. When you eat or drink fewer calories than your body needs, your body burns fat to get the energy it needs. Calorie counting means keeping track of how many calories you eat and drink each day. Calorie counting can be helpful if you need to lose weight. If you eat fewer calories than your body  needs, you should lose weight. Ask your health care provider what a healthy weight is for you. For calorie counting to work, you will need to eat the right number of calories each day to lose a healthy amount of weight per week. A dietitian can help you figure out how many calories you need in a day and will suggest ways to reach your calorie goal.  A healthy amount of weight to lose each week is usually 1-2 lb (0.5-0.9 kg). This usually means that your daily calorie intake should be reduced by 500-750 calories.  Eating 1,200-1,500 calories a day can help most women lose weight.  Eating 1,500-1,800 calories a day can help most men lose weight. What do I need to know about calorie counting? Work with your health care provider or dietitian to determine how many calories you should get each day. To meet your daily calorie goal, you will need to:  Find out how many calories are in each food that you would like to eat. Try to do this before you eat.  Decide how much of the food you plan to eat.  Keep a food log. Do this by writing down what you ate and how many calories it had. To successfully lose weight, it is important to balance calorie counting with a healthy lifestyle that includes regular activity. Where do I find calorie information? The number of calories in a food can be found on a Nutrition Facts label. If a food does not have a Nutrition Facts  label, try to look up the calories online or ask your dietitian for help. Remember that calories are listed per serving. If you choose to have more than one serving of a food, you will have to multiply the calories per serving by the number of servings you plan to eat. For example, the label on a package of bread might say that a serving size is 1 slice and that there are 90 calories in a serving. If you eat 1 slice, you will have eaten 90 calories. If you eat 2 slices, you will have eaten 180 calories.   How do I keep a food log? After each time  that you eat, record the following in your food log as soon as possible:  What you ate. Be sure to include toppings, sauces, and other extras on the food.  How much you ate. This can be measured in cups, ounces, or number of items.  How many calories were in each food and drink.  The total number of calories in the food you ate. Keep your food log near you, such as in a pocket-sized notebook or on an app or website on your mobile phone. Some programs will calculate calories for you and show you how many calories you have left to meet your daily goal. What are some portion-control tips?  Know how many calories are in a serving. This will help you know how many servings you can have of a certain food.  Use a measuring cup to measure serving sizes. You could also try weighing out portions on a kitchen scale. With time, you will be able to estimate serving sizes for some foods.  Take time to put servings of different foods on your favorite plates or in your favorite bowls and cups so you know what a serving looks like.  Try not to eat straight from a food's packaging, such as from a bag or box. Eating straight from the package makes it hard to see how much you are eating and can lead to overeating. Put the amount you would like to eat in a cup or on a plate to make sure you are eating the right portion.  Use smaller plates, glasses, and bowls for smaller portions and to prevent overeating.  Try not to multitask. For example, avoid watching TV or using your computer while eating. If it is time to eat, sit down at a table and enjoy your food. This will help you recognize when you are full. It will also help you be more mindful of what and how much you are eating. What are tips for following this plan? Reading food labels  Check the calorie count compared with the serving size. The serving size may be smaller than what you are used to eating.  Check the source of the calories. Try to choose  foods that are high in protein, fiber, and vitamins, and low in saturated fat, trans fat, and sodium. Shopping  Read nutrition labels while you shop. This will help you make healthy decisions about which foods to buy.  Pay attention to nutrition labels for low-fat or fat-free foods. These foods sometimes have the same number of calories or more calories than the full-fat versions. They also often have added sugar, starch, or salt to make up for flavor that was removed with the fat.  Make a grocery list of lower-calorie foods and stick to it. Cooking  Try to cook your favorite foods in a healthier way. For example, try  baking instead of frying.  Use low-fat dairy products. Meal planning  Use more fruits and vegetables. One-half of your plate should be fruits and vegetables.  Include lean proteins, such as chicken, [place], and fish. Lifestyle Each week, aim to do one of the following:  150 minutes of moderate exercise, such as walking.  75 minutes of vigorous exercise, such as running. General information  Know how many calories are in the foods you eat most often. This will help you calculate calorie counts faster.  Find a way of tracking calories that works for you. Get creative. Try different apps or programs if writing down calories does not work for you. What foods should I eat?  Eat nutritious foods. It is better to have a nutritious, high-calorie food, such as an avocado, than a food with few nutrients, such as a bag of potato chips.  Use your calories on foods and drinks that will fill you up and will not leave you hungry soon after eating. ? Examples of foods that fill you up are nuts and nut butters, vegetables, lean proteins, and high-fiber foods such as whole grains. High-fiber foods are foods with more than 5 g of fiber per serving.  Pay attention to calories in drinks. Low-calorie drinks include water and unsweetened drinks. The items listed above may not be a  complete list of foods and beverages you can eat. Contact a dietitian for more information.   What foods should I limit? Limit foods or drinks that are not good sources of vitamins, minerals, or protein or that are high in unhealthy fats. These include:  Candy.  Other sweets.  Sodas, specialty coffee drinks, alcohol, and juice. The items listed above may not be a complete list of foods and beverages you should avoid. Contact a dietitian for more information. How do I count calories when eating out?  Pay attention to portions. Often, portions are much larger when eating out. Try these tips to keep portions smaller: ? Consider sharing a meal instead of getting your own. ? If you get your own meal, eat only half of it. Before you start eating, ask for a container and put half of your meal into it. ? When available, consider ordering smaller portions from the menu instead of full portions.  Pay attention to your food and drink choices. Knowing the way food is cooked and what is included with the meal can help you eat fewer calories. ? If calories are listed on the menu, choose the lower-calorie options. ? Choose dishes that include vegetables, fruits, whole grains, low-fat dairy products, and lean proteins. ? Choose items that are boiled, broiled, grilled, or steamed. Avoid items that are buttered, battered, fried, or served with cream sauce. Items labeled as crispy are usually fried, unless stated otherwise. ? Choose water, low-fat milk, unsweetened iced tea, or other drinks without added sugar. If you want an alcoholic beverage, choose a lower-calorie option, such as a glass of wine or light beer. ? Ask for dressings, sauces, and syrups on the side. These are usually high in calories, so you should limit the amount you eat. ? If you want a salad, choose a garden salad and ask for grilled meats. Avoid extra toppings such as bacon, cheese, or fried items. Ask for the dressing on the side, or ask  for olive oil and vinegar or lemon to use as dressing.  Estimate how many servings of a food you are given. Knowing serving sizes will help you be aware  of how much food you are eating at restaurants. Where to find more information  Centers for Disease Control and Prevention: http://www.wolf.info/  [place] Department of Agriculture: http://www.wilson-mendoza.org/ Summary  Calorie counting means keeping track of how many calories you eat and drink each day. If you eat fewer calories than your body needs, you should lose weight.  A healthy amount of weight to lose per week is usually 1-2 lb (0.5-0.9 kg). This usually means reducing your daily calorie intake by 500-750 calories.  The number of calories in a food can be found on a Nutrition Facts label. If a food does not have a Nutrition Facts label, try to look up the calories online or ask your dietitian for help.  Use smaller plates, glasses, and bowls for smaller portions and to prevent overeating.  Use your calories on foods and drinks that will fill you up and not leave you hungry shortly after a meal. This information is not intended to replace advice given to you by your health care provider. Make sure you discuss any questions you have with your health care provider. Document Revised: 07/28/2019 Document Reviewed: 07/28/2019 Elsevier Patient Education  2021 [place] 28-72 Years Old, Female Preventive care refers to lifestyle choices and visits with your health care provider that can promote health and wellness. This includes:  A yearly physical exam. This is also called an annual wellness visit.  Regular dental and eye exams.  Immunizations.  Screening for certain conditions.  Healthy lifestyle choices, such as: ? Eating a healthy diet. ? Getting regular exercise. ? Not using drugs or products that contain nicotine and tobacco. ? Limiting alcohol use. What can I expect for my preventive care visit? Physical exam Your health care  provider may check your:  Height and weight. These may be used to calculate your BMI (body mass index). BMI is a measurement that tells if you are at a healthy weight.  Heart rate and blood pressure.  Body temperature.  Skin for abnormal spots. Counseling Your health care provider may ask you questions about your:  Past medical problems.  Family's medical history.  Alcohol, tobacco, and drug use.  Emotional well-being.  Home life and relationship well-being.  Sexual activity.  Diet, exercise, and sleep habits.  Work and work Statistician.  Access to firearms.  Method of birth control.  Menstrual cycle.  Pregnancy history. What immunizations do I need? Vaccines are usually given at various ages, according to a schedule. Your health care provider will recommend vaccines for you based on your age, medical history, and lifestyle or other factors, such as travel or where you work.   What tests do I need? Blood tests  Lipid and cholesterol levels. These may be checked every 5 years starting at age 53.  Hepatitis C test.  Hepatitis B test. Screening  Diabetes screening. This is done by checking your blood sugar (glucose) after you have not eaten for a while (fasting).  STD (sexually transmitted disease) testing, if you are at risk.  BRCA-related cancer screening. This may be done if you have a family history of breast, ovarian, tubal, or peritoneal cancers.  Pelvic exam and Pap test. This may be done every 3 years starting at age 30. Starting at age 21, this may be done every 5 years if you have a Pap test in combination with an HPV test. Talk with your health care provider about your test results, treatment options, and if necessary, the need for more tests.  Follow these instructions at home: Eating and drinking  Eat a healthy diet that includes fresh fruits and vegetables, whole grains, lean protein, and low-fat dairy products.  Take vitamin and mineral  supplements as recommended by your health care provider.  Do not drink alcohol if: ? Your health care provider tells you not to drink. ? You are pregnant, may be pregnant, or are planning to become pregnant.  If you drink alcohol: ? Limit how much you have to 0-1 drink a day. ? Be aware of how much alcohol is in your drink. In the [place] one drink equals one 12 oz bottle of beer (355 mL), one 5 oz glass of wine (148 mL), or one 1 oz glass of hard liquor (44 mL).   Lifestyle  Take daily care of your teeth and gums. Brush your teeth every morning and night with fluoride toothpaste. Floss one time each day.  Stay active. Exercise for at least 30 minutes 5 or more days each week.  Do not use any products that contain nicotine or tobacco, such as cigarettes, e-cigarettes, and chewing tobacco. If you need help quitting, ask your health care provider.  Do not use drugs.  If you are sexually active, practice safe sex. Use a condom or other form of protection to prevent STIs (sexually transmitted infections).  If you do not wish to become pregnant, use a form of birth control. If you plan to become pregnant, see your health care provider for a prepregnancy visit.  Find healthy ways to cope with stress, such as: ? Meditation, yoga, or listening to music. ? Journaling. ? Talking to a trusted person. ? Spending time with friends and family. Safety  Always wear your seat belt while driving or riding in a vehicle.  Do not drive: ? If you have been drinking alcohol. Do not ride with someone who has been drinking. ? When you are tired or distracted. ? While texting.  Wear a helmet and other protective equipment during sports activities.  If you have firearms in your house, make sure you follow all gun safety procedures.  Seek help if you have been physically or sexually abused. What's next?  Go to your health care provider once a year for an annual wellness visit.  Ask your health care  provider how often you should have your eyes and teeth checked.  Stay up to date on all vaccines. This information is not intended to replace advice given to you by your health care provider. Make sure you discuss any questions you have with your health care provider. Document Revised: 02/12/2020 Document Reviewed: 02/25/2018 Elsevier Patient Education  2021 [place], MD [facility]

## 2021-01-07 ENCOUNTER — Ambulatory Visit (INDEPENDENT_AMBULATORY_CARE_PROVIDER_SITE_OTHER): Payer: Medicaid Other | Admitting: Obstetrics

## 2021-01-07 ENCOUNTER — Encounter: Payer: Self-pay | Admitting: Obstetrics

## 2021-01-07 ENCOUNTER — Other Ambulatory Visit: Payer: Self-pay

## 2021-01-07 VITALS — BP 122/92 | Wt 259.0 lb

## 2021-01-07 DIAGNOSIS — Z3009 Encounter for other general counseling and advice on contraception: Secondary | ICD-10-CM

## 2021-01-07 DIAGNOSIS — Z3046 Encounter for surveillance of implantable subdermal contraceptive: Secondary | ICD-10-CM

## 2021-01-07 DIAGNOSIS — B9689 Other specified bacterial agents as the cause of diseases classified elsewhere: Secondary | ICD-10-CM

## 2021-01-07 DIAGNOSIS — Z30011 Encounter for initial prescription of contraceptive pills: Secondary | ICD-10-CM

## 2021-01-07 MED ORDER — AMOXICILLIN-POT CLAVULANATE 875-125 MG PO TABS: 1.0000 | ORAL_TABLET | Freq: Two times a day (BID) | ORAL | 0 refills | Status: DC

## 2021-01-07 MED ORDER — LO LOESTRIN FE 1 MG-10 MCG / 10 MCG PO TABS: 1.0000 | ORAL_TABLET | Freq: Every day | ORAL | 11 refills | Status: DC

## 2021-01-07 NOTE — Progress Notes (Signed)
Pt in office for Nexplanon removal, may be interested in pill.

## 2021-01-07 NOTE — Progress Notes (Signed)
NEXPLANON REMOVAL NOTE  Date of LMP:   unknown  Contraception used: *Nexplanon   Indications:  The patient desires removal of Nexplanon because of excessive weight gain.  She understands risks, benefits, and alternatives to Nexplanon and would like to proceed.  She plans to take OCP's for birth control.  Anesthesia:   Lidocaine 1% plain.  Procedure:  A time-out was performed confirming the procedure and the patient's allergy status.  Complications: None                      The rod was palpated and the area was sterilely prepped.  The area beneath the distal tip was anesthetized with 1% xylocaine and the skin incised                       Over the tip and the tip was exposed, grasped with forcep and removed intact.  3 interrupted sutures of 4-0 Vicryl was used to close incision.  Steri strip                       And a bandage applied and the arm was wrapped with gauze bandage.  The patient tolerated well.  Instructions:  The patient was instructed to remove the dressing in 24 hours and that some bruising is to be expected.  She was advised to use over the counter analgesics as needed for any pain at the site.  She is to keep the area dry for 24 hours and to call if her hand or arm becomes cold, numb, or blue.  Return visit:  Return in 2 weeks  Brock Bad, MD 01/07/2021 11:20 AM

## 2021-01-09 ENCOUNTER — Encounter: Payer: Self-pay | Admitting: Obstetrics

## 2021-01-29 ENCOUNTER — Ambulatory Visit: Payer: Medicaid Other | Admitting: Obstetrics

## 2021-12-21 ENCOUNTER — Other Ambulatory Visit: Payer: Self-pay | Admitting: Obstetrics

## 2021-12-21 DIAGNOSIS — Z30011 Encounter for initial prescription of contraceptive pills: Secondary | ICD-10-CM

## 2022-04-09 ENCOUNTER — Ambulatory Visit: Payer: Medicaid Other | Admitting: Internal Medicine

## 2022-04-09 ENCOUNTER — Encounter: Payer: Self-pay | Admitting: Internal Medicine

## 2022-04-09 DIAGNOSIS — Z30011 Encounter for initial prescription of contraceptive pills: Secondary | ICD-10-CM

## 2022-04-09 MED ORDER — LO LOESTRIN FE 1 MG-10 MCG / 10 MCG PO TABS: 1.0000 | ORAL_TABLET | Freq: Every day | ORAL | 2 refills | Status: DC

## 2022-04-09 NOTE — Progress Notes (Signed)
Established Patient Office Visit     CC/Reason for Visit: Needs birth control refills  HPI: Veronica Hensley is a 31 y.o. female who is coming in today for the above mentioned reasons. Past Medical History is significant for: Obesity.  She had her Nexplanon removed last year and saw GYN and was prescribed birth control pills.  She is needing a refill.  She was able to get in with me quicker than with her GYN but she does have an appointment that is upcoming.  She is feeling well and has no concerns.  She declines COVID and flu vaccines today.   Past Medical/Surgical History: Past Medical History:  Diagnosis Date   Medical history non-contributory     Past Surgical History:  Procedure Laterality Date   NO PAST SURGERIES      Social History:  reports that she has never smoked. She has never used smokeless tobacco. She reports that she does not currently use alcohol. She reports that she does not use drugs.  Allergies: No Known Allergies  Family History:  Family History  Problem Relation Age of Onset   Hypertension Mother    Hypertension Father    Diabetes Father    Stroke Father      Current Outpatient Medications:    amoxicillin-clavulanate (AUGMENTIN) 875-125 MG tablet, Take 1 tablet by mouth 2 (two) times daily. (Patient not taking: Reported on 04/09/2022), Disp: 14 tablet, Rfl: 0   LO LOESTRIN FE 1 MG-10 MCG / 10 MCG tablet, Take 1 tablet by mouth daily., Disp: 28 tablet, Rfl: 2  Review of Systems:  Constitutional: Denies fever, chills, diaphoresis, appetite change and fatigue.  HEENT: Denies photophobia, eye pain, redness, hearing loss, ear pain, congestion, sore throat, rhinorrhea, sneezing, mouth sores, trouble swallowing, neck pain, neck stiffness and tinnitus.   Respiratory: Denies SOB, DOE, cough, chest tightness,  and wheezing.   Cardiovascular: Denies chest pain, palpitations and leg swelling.  Gastrointestinal: Denies nausea, vomiting, abdominal pain,  diarrhea, constipation, blood in stool and abdominal distention.  Genitourinary: Denies dysuria, urgency, frequency, hematuria, flank pain and difficulty urinating.  Endocrine: Denies: hot or cold intolerance, sweats, changes in hair or nails, polyuria, polydipsia. Musculoskeletal: Denies myalgias, back pain, joint swelling, arthralgias and gait problem.  Skin: Denies pallor, rash and wound.  Neurological: Denies dizziness, seizures, syncope, weakness, light-headedness, numbness and headaches.  Hematological: Denies adenopathy. Easy bruising, personal or family bleeding history  Psychiatric/Behavioral: Denies suicidal ideation, mood changes, confusion, nervousness, sleep disturbance and agitation    Physical Exam: Vitals:   04/09/22 0827  BP: 112/72  Pulse: 70  Resp: 16  Temp: 98.3 F (36.8 C)  TempSrc: Oral  SpO2: 97%  Weight: 241 lb 1 oz (109.3 kg)  Height: 5\' 6"  (1.676 m)    Body mass index is 38.91 kg/m.   Constitutional: NAD, calm, comfortable Eyes: PERRL, lids and conjunctivae normal, wears corrective lenses ENMT: Mucous membranes are moist.  Respiratory: clear to auscultation bilaterally, no wheezing, no crackles. Normal respiratory effort. No accessory muscle use.  Cardiovascular: Regular rate and rhythm, no murmurs / rubs / gallops. No extremity edema.   Psychiatric: Normal judgment and insight. Alert and oriented x 3. Normal mood.    Impression and Plan:  Encounter for initial prescription of contraceptive pills - Plan: LO LOESTRIN FE 1 MG-10 MCG / 10 MCG tablet  -Lo Loestrin prescription sent. -Continue follow-up with GYN as scheduled.  Time spent:21 minutes reviewing chart, interviewing and examining patient and formulating plan of  care.      Lelon Frohlich, MD [facility] at [facility]

## 2022-04-10 ENCOUNTER — Other Ambulatory Visit: Payer: Self-pay | Admitting: Obstetrics and Gynecology

## 2022-04-10 DIAGNOSIS — Z30011 Encounter for initial prescription of contraceptive pills: Secondary | ICD-10-CM

## 2022-06-11 ENCOUNTER — Encounter: Payer: Medicaid Other | Admitting: Obstetrics & Gynecology

## 2022-07-16 ENCOUNTER — Other Ambulatory Visit: Payer: Self-pay | Admitting: Internal Medicine

## 2022-07-16 DIAGNOSIS — Z30011 Encounter for initial prescription of contraceptive pills: Secondary | ICD-10-CM

## 2022-08-20 ENCOUNTER — Encounter: Payer: Medicaid Other | Admitting: Obstetrics and Gynecology

## 2022-09-04 ENCOUNTER — Ambulatory Visit (INDEPENDENT_AMBULATORY_CARE_PROVIDER_SITE_OTHER): Payer: Medicaid Other | Admitting: Obstetrics and Gynecology

## 2022-09-04 ENCOUNTER — Encounter: Payer: Self-pay | Admitting: Obstetrics and Gynecology

## 2022-09-04 ENCOUNTER — Other Ambulatory Visit (HOSPITAL_COMMUNITY)
Admission: RE | Admit: 2022-09-04 | Discharge: 2022-09-04 | Disposition: A | Discharge status: Home / Self Care | Payer: Medicaid Other | Source: Ambulatory Visit | Attending: Obstetrics and Gynecology | Admitting: Obstetrics and Gynecology

## 2022-09-04 VITALS — BP 126/82 | HR 66 | Ht 66.0 in | Wt 252.6 lb

## 2022-09-04 DIAGNOSIS — Z1339 Encounter for screening examination for other mental health and behavioral disorders: Secondary | ICD-10-CM | POA: Diagnosis not present

## 2022-09-04 DIAGNOSIS — Z01419 Encounter for gynecological examination (general) (routine) without abnormal findings: Secondary | ICD-10-CM | POA: Diagnosis present

## 2022-09-04 DIAGNOSIS — Z3041 Encounter for surveillance of contraceptive pills: Secondary | ICD-10-CM

## 2022-09-04 MED ORDER — LO LOESTRIN FE 1 MG-10 MCG / 10 MCG PO TABS: 1.0000 | ORAL_TABLET | Freq: Every day | ORAL | 2 refills | Status: DC

## 2022-09-04 NOTE — Progress Notes (Signed)
   WELL-WOMAN PHYSICAL & PAP Patient name: Veronica Hensley MRN ED:[id number]  Date of birth: October 22, 1990 Chief Complaint:   Gynecologic Exam  History of Present Illness:   Veronica Hensley is a 32 y.o. G25P1001 Asian female being seen today for a routine well-woman exam.  Current complaints: wants OCP refilled  PCP: Lelon Frohlich, MD      does not desire labs Patient's last menstrual period was 08/20/2022 (approximate). The current method of family planning is OCP (estrogen/progesterone).  Last pap 04/26/2018. Results were: normal Last mammogram: n/a. Family h/o breast cancer: No Last colonoscopy: n/a. Family h/o colorectal cancer: No Review of Systems:   Pertinent items are noted in HPI Denies any headaches, blurred vision, fatigue, shortness of breath, chest pain, abdominal pain, abnormal vaginal discharge/itching/odor/irritation, problems with periods, bowel movements, urination, or intercourse unless otherwise stated above. Pertinent History Reviewed:  Reviewed past medical,surgical, social and family history.  Reviewed problem list, medications and allergies. Physical Assessment:   Vitals:   09/04/22 1054  BP: 126/82  Pulse: 66  Weight: 252 lb 9.6 oz (114.6 kg)  Height: '5\' 6"'$  (1.676 m)  Body mass index is 40.77 kg/m.        Physical Examination:   General appearance - well appearing, and in no distress  Mental status - alert, oriented to person, place, and time  Psych:  She has a normal mood and affect  Skin - warm and dry, normal color, no suspicious lesions noted  Chest - effort normal, all lung fields clear to auscultation bilaterally  Heart - normal rate and regular rhythm  Neck:  midline trachea, no thyromegaly or nodules  Breasts - breasts appear normal, no suspicious masses, no skin or nipple changes or  axillary nodes  Abdomen - soft, nontender, nondistended, no masses or organomegaly  Pelvic - VULVA: normal appearing vulva with no masses, tenderness or lesions  VAGINA:  normal appearing vagina with normal color and discharge, no lesions  CERVIX: normal appearing cervix without discharge or lesions, no CMT  Thin prep pap is done with HR HPV cotesting  UTERUS: uterus is felt to be normal size, shape, consistency and nontender   ADNEXA: No adnexal masses or tenderness noted.  Rectal - deferred  Extremities:  No swelling or varicosities noted  No results found for this or any previous visit (from the past 24 hour(s)).  Assessment & Plan:  1) Encounter for well woman exam with routine gynecological exam - Cytology - PAP( [facility])  2. Encounter for surveillance of contraceptive pills - Rx: LO LOESTRIN FE 1 MG-10 MCG / 10 MCG tablet; Take 1 tablet by mouth daily.  Dispense: 28 tablet; Refill: 2   Labs/procedures today: pap  Mammogram at age 20 or sooner if problems Colonoscopy at age 1 or sooner if problems  No orders of the defined types were placed in this encounter.   Meds:  Meds ordered this encounter  Medications   LO LOESTRIN FE 1 MG-10 MCG / 10 MCG tablet    Sig: Take 1 tablet by mouth daily.    Dispense:  28 tablet    Refill:  2    Order Specific Question:   Supervising Provider    Answer:   Donnamae Jude [id number]    Follow-up: No follow-ups on file.  Laury Deep MSN, CNM 09/04/2022 12:21 PM

## 2022-09-04 NOTE — Progress Notes (Signed)
Pt presents for AEX. Pt needs BC refill. Declines STD testing. No other concerns.

## 2022-09-09 ENCOUNTER — Encounter: Payer: Self-pay | Admitting: Obstetrics and Gynecology

## 2022-09-09 LAB — CYTOLOGY - PAP
Comment: NEGATIVE
Diagnosis: NEGATIVE
High risk HPV: NEGATIVE

## 2022-10-15 ENCOUNTER — Encounter: Payer: Self-pay | Admitting: Internal Medicine

## 2022-10-15 ENCOUNTER — Ambulatory Visit: Payer: Medicaid Other | Admitting: Internal Medicine

## 2022-10-15 VITALS — BP 120/78 | HR 83 | Temp 97.8°F | Wt 254.7 lb

## 2022-10-15 DIAGNOSIS — M5442 Lumbago with sciatica, left side: Secondary | ICD-10-CM

## 2022-10-15 DIAGNOSIS — G8929 Other chronic pain: Secondary | ICD-10-CM | POA: Diagnosis not present

## 2022-10-15 MED ORDER — METHYLPREDNISOLONE ACETATE 80 MG/ML IJ SUSP
80.0000 mg | Freq: Once | INTRAMUSCULAR | Status: AC
  Administered 2022-10-15: 80 mg via INTRAMUSCULAR

## 2022-10-15 MED ORDER — MELOXICAM 7.5 MG PO TABS
7.5000 mg | ORAL_TABLET | Freq: Every day | ORAL | 0 refills | Status: DC
Start: 2022-10-15 — End: 2022-11-09

## 2022-10-15 NOTE — Progress Notes (Signed)
     Established Patient Office Visit     CC/Reason for Visit: Low back and left leg pain  HPI: Veronica Hensley is a 32 y.o. female who is coming in today for the above mentioned reasons.  For the past 4 months she has been experiencing lower left back/tailbone pain.  No injury that she can recall.  This pain radiates down the back of her leg around her knee goes anterior and causes numbness into her right great toe.  Past Medical/Surgical History: Past Medical History:  Diagnosis Date   Medical history non-contributory     Past Surgical History:  Procedure Laterality Date   NO PAST SURGERIES      Social History:  reports that she has been smoking cigarettes. She has never used smokeless tobacco. She reports that she does not currently use alcohol. She reports that she does not use drugs.  Allergies: No Known Allergies  Family History:  Family History  Problem Relation Age of Onset   Hypertension Mother    Hypertension Father    Diabetes Father    Stroke Father      Current Outpatient Medications:    LO LOESTRIN FE 1 MG-10 MCG / 10 MCG tablet, Take 1 tablet by mouth daily., Disp: 28 tablet, Rfl: 2   meloxicam (MOBIC) 7.5 MG tablet, Take 1 tablet (7.5 mg total) by mouth daily., Disp: 30 tablet, Rfl: 0  Current Facility-Administered Medications:    methylPREDNISolone acetate (DEPO-MEDROL) injection 80 mg, 80 mg, Intramuscular, Once, Philip Aspen, Limmie Patricia, MD  Review of Systems:  Negative unless indicated in HPI.   Physical Exam: Vitals:   10/15/22 1117  BP: 120/78  Pulse: 83  Temp: 97.8 F (36.6 C)  TempSrc: Oral  SpO2: 99%  Weight: 254 lb 11.2 oz (115.5 kg)    Body mass index is 41.11 kg/m.   Physical Exam Vitals reviewed.  Constitutional:      Appearance: Normal appearance.  HENT:     Head: Normocephalic and atraumatic.  Eyes:     Conjunctiva/sclera: Conjunctivae normal.     Pupils: Pupils are equal, round, and reactive to light.   Musculoskeletal:     Cervical back: Normal.     Thoracic back: Normal.     Lumbar back: Tenderness present.       Back:  Skin:    General: Skin is warm and dry.  Neurological:     General: No focal deficit present.     Mental Status: She is alert and oriented to person, place, and time.  Psychiatric:        Mood and Affect: Mood normal.        Behavior: Behavior normal.        Thought Content: Thought content normal.        Judgment: Judgment normal.      Impression and Plan:  Chronic left-sided low back pain with left-sided sciatica - Plan: methylPREDNISolone acetate (DEPO-MEDROL) injection 80 mg, meloxicam (MOBIC) 7.5 MG tablet, Ambulatory referral to Physical Therapy  -Suspect this is sciatica. -Advised icing, as needed NSAIDs, back stretches, local massage therapy. -Will send in referral for PT, meloxicam to take for 2 weeks. -80 mg IM medrol in office today.  Time spent:30 minutes reviewing chart, interviewing and examining patient and formulating plan of care.     Chaya Jan, MD [facility] at [facility]

## 2022-11-09 ENCOUNTER — Encounter (HOSPITAL_COMMUNITY): Payer: Self-pay

## 2022-11-09 ENCOUNTER — Ambulatory Visit (HOSPITAL_COMMUNITY)
Admission: EM | Admit: 2022-11-09 | Discharge: 2022-11-09 | Disposition: A | Discharge status: Home / Self Care | Payer: Medicaid Other | Attending: Emergency Medicine | Admitting: Emergency Medicine

## 2022-11-09 DIAGNOSIS — M5442 Lumbago with sciatica, left side: Secondary | ICD-10-CM | POA: Diagnosis not present

## 2022-11-09 DIAGNOSIS — G8929 Other chronic pain: Secondary | ICD-10-CM

## 2022-11-09 MED ORDER — DEXAMETHASONE SODIUM PHOSPHATE 10 MG/ML IJ SOLN
INTRAMUSCULAR | Status: AC
  Filled 2022-11-09: qty 1

## 2022-11-09 MED ORDER — METHYLPREDNISOLONE SODIUM SUCC 125 MG IJ SOLR
INTRAMUSCULAR | Status: AC
  Filled 2022-11-09: qty 2

## 2022-11-09 MED ORDER — DEXAMETHASONE SODIUM PHOSPHATE 10 MG/ML IJ SOLN: 10.0000 mg | Freq: Once | INTRAMUSCULAR | Status: DC

## 2022-11-09 MED ORDER — MELOXICAM 7.5 MG PO TABS: 7.5000 mg | ORAL_TABLET | Freq: Every day | ORAL | 0 refills | Status: DC

## 2022-11-09 MED ORDER — METHYLPREDNISOLONE SODIUM SUCC 125 MG IJ SOLR
80.0000 mg | Freq: Once | INTRAMUSCULAR | Status: AC
  Administered 2022-11-09: 80 mg via INTRAMUSCULAR

## 2022-11-09 NOTE — ED Triage Notes (Signed)
Back and leg pain onset December 2023. Patient got a steroid shot a month ago.  Pain worse last night. No recent falls or injuries.

## 2022-11-09 NOTE — Discharge Instructions (Addendum)
Your symptoms are consistent with sciatica.  We have given you an IM steroid injection in clinic, you can take the Mobic daily for pain.  Please follow-up with [facility] for further evaluation and potential physical therapy.  Please seek immediate care if you develop inability to walk, and her leg numbness, or unable to urinate or defecate, or any new concerning symptoms.

## 2022-11-09 NOTE — ED Provider Notes (Signed)
[facility]    CSN: [id number] Arrival date & time: 11/09/22  1331      History   Chief Complaint Chief Complaint  Patient presents with   Back Pain   Leg Pain    HPI Veronica Hensley is a 32 y.o. female.   Patient presents to clinic for ongoing lower back pain.  She reports that this flareup started last night, bilateral lower back pain that is worse on the left side, the pain radiates down to her left thigh.  She denies incontinence, denies any leg numbness, denies any recent falls or trauma.  Reports this is similar to a sciatica flare she had last month which was treated with IM steroids and Mobic.  She is ambulatory with pain.     The history is provided by the patient and medical records.  Back Pain Associated symptoms: leg pain and numbness   Associated symptoms: no chest pain, no dysuria, no fever and no weakness   Leg Pain Associated symptoms: back pain   Associated symptoms: no fever     Past Medical History:  Diagnosis Date   Medical history non-contributory     Patient Active Problem List   Diagnosis Date Noted   Bradycardia, unspecified 09/18/2018   UTI due to Klebsiella species 05/24/2018   Biological false positive RPR test 05/08/2018    Past Surgical History:  Procedure Laterality Date   NO PAST SURGERIES      OB History     Gravida  1   Para  1   Term  1   Preterm      AB      Living  1      SAB      IAB      Ectopic      Multiple  0   Live Births  1            Home Medications    Prior to Admission medications   Medication Sig Start Date End Date Taking? Authorizing Provider  LO LOESTRIN FE 1 MG-10 MCG / 10 MCG tablet Take 1 tablet by mouth daily. 09/04/22  Yes Raelyn Mora, CNM  meloxicam (MOBIC) 7.5 MG tablet Take 1 tablet (7.5 mg total) by mouth daily. 11/09/22   Maeci Kalbfleisch, [place] N, FNP    Family History Family History  Problem Relation Age of Onset   Hypertension Mother    Hypertension Father     Diabetes Father    Stroke Father     Social History Social History   Tobacco Use   Smoking status: Every Day    Types: Cigarettes   Smokeless tobacco: Never  Vaping Use   Vaping Use: Never used  Substance Use Topics   Alcohol use: Not Currently    Comment: occ   Drug use: No     Allergies   Patient has no known allergies.   Review of Systems Review of Systems  Constitutional:  Negative for fever.  Respiratory:  Negative for cough and shortness of breath.   Cardiovascular:  Negative for chest pain.  Genitourinary:  Negative for decreased urine volume, difficulty urinating and dysuria.  Musculoskeletal:  Positive for back pain and gait problem. Negative for joint swelling.  Neurological:  Positive for numbness. Negative for syncope and weakness.     Physical Exam Triage Vital Signs ED Triage Vitals  Enc Vitals Group     BP 11/09/22 1350 (!) 140/82     Pulse Rate 11/09/22 1350 61  Resp 11/09/22 1350 18     Temp 11/09/22 1350 98.4 F (36.9 C)     Temp Source 11/09/22 1350 Oral     SpO2 11/09/22 1350 96 %     Weight --      Height --      Head Circumference --      Peak Flow --      Pain Score 11/09/22 1348 9     Pain Loc --      Pain Edu? --      Excl. in GC? --    No data found.  Updated Vital Signs BP (!) 140/82 (BP Location: Left Arm)   Pulse 61   Temp 98.4 F (36.9 C) (Oral)   Resp 18   LMP 10/19/2022 (Approximate)   SpO2 96%   Visual Acuity Right Eye Distance:   Left Eye Distance:   Bilateral Distance:    Right Eye Near:   Left Eye Near:    Bilateral Near:     Physical Exam Vitals and nursing note reviewed.  Constitutional:      Appearance: Normal appearance.  HENT:     Head: Normocephalic and atraumatic.     Right Ear: External ear normal.     Left Ear: External ear normal.     Nose: Nose normal.     Mouth/Throat:     Mouth: Mucous membranes are moist.  Eyes:     Conjunctiva/sclera: Conjunctivae normal.  Cardiovascular:      Rate and Rhythm: Normal rate and regular rhythm.     Heart sounds: Normal heart sounds, S1 normal and S2 normal. No murmur heard. Pulmonary:     Effort: Pulmonary effort is normal. No respiratory distress.     Breath sounds: Normal breath sounds.     Comments: Lungs vesicular posteriorly. Musculoskeletal:     Cervical back: Normal.     Thoracic back: Normal.     Lumbar back: Tenderness present. Positive left straight leg raise test.       Back:     Comments: Tenderness to palpation of lumbar musculoskeletal areas, pain radiates down left leg.  Consistent with sciatica.  Skin:    General: Skin is warm and dry.  Neurological:     General: No focal deficit present.     Mental Status: She is alert and oriented to person, place, and time.  Psychiatric:        Mood and Affect: Mood normal.        Behavior: Behavior normal. Behavior is cooperative.      UC Treatments / Results  Labs (all labs ordered are listed, but only abnormal results are displayed) Labs Reviewed - No data to display  EKG   Radiology No results found.  Procedures Procedures (including critical care time)  Medications Ordered in UC Medications  dexamethasone (DECADRON) injection 10 mg (has no administration in time range)    Initial Impression / Assessment and Plan / UC Course  I have reviewed the triage vital signs and the nursing notes.  Pertinent labs & imaging results that were available during my care of the patient were reviewed by me and considered in my medical decision making (see chart for details).  Vitals and triage reviewed, patient is hemodynamically stable.  Lumbar back pain, worse on the left side, radiating down left leg, consistent with sciatica, history of same.  Previous relief with IM steroid injection and Mobic, will give Decadron IM and Mobic again.  Encouraged to follow-up with [facility]  sports medicine for further evaluation and PT.  Patient verbalized understanding, follow-up  care, return precautions, and emergency care discussed, no questions at this time.     Final Clinical Impressions(s) / UC Diagnoses   Final diagnoses:  Acute bilateral low back pain with left-sided sciatica     Discharge Instructions      Your symptoms are consistent with sciatica.  We have given you an IM steroid injection in clinic, you can take the Mobic daily for pain.  Please follow-up with [facility] for further evaluation and potential physical therapy.  Please seek immediate care if you develop inability to walk, and her leg numbness, or unable to urinate or defecate, or any new concerning symptoms.     ED Prescriptions     Medication Sig Dispense Auth. Provider   meloxicam (MOBIC) 7.5 MG tablet Take 1 tablet (7.5 mg total) by mouth daily. 30 tablet Cerissa Zeiger, [place] N, [place]      PDMP not reviewed this encounter.   Valor [place], [place] N, [place] 11/09/22 1407

## 2022-11-10 ENCOUNTER — Ambulatory Visit: Payer: Medicaid Other | Admitting: Internal Medicine

## 2022-12-05 ENCOUNTER — Other Ambulatory Visit: Payer: Self-pay | Admitting: Obstetrics and Gynecology

## 2022-12-05 DIAGNOSIS — Z3041 Encounter for surveillance of contraceptive pills: Secondary | ICD-10-CM

## 2023-03-05 ENCOUNTER — Ambulatory Visit: Payer: Medicaid Other

## 2023-03-05 VITALS — BP 126/87 | HR 75

## 2023-03-05 DIAGNOSIS — Z3200 Encounter for pregnancy test, result unknown: Secondary | ICD-10-CM

## 2023-03-05 LAB — POCT URINE PREGNANCY: Preg Test, Ur: POSITIVE — AB

## 2023-03-05 NOTE — Progress Notes (Signed)
..  Veronica Hensley presents today for UPT. She has no unusual complaints. LMP:12/20/22     OBJECTIVE: Appears well, in no apparent distress.  OB History     Gravida  2   Para  1   Term  1   Preterm      AB      Living  1      SAB      IAB      Ectopic      Multiple  0   Live Births  1          Home UPT Result:Positive  In-Office UPT result:Positive I have reviewed the patient's medical, obstetrical, social, and family histories, and medications.   ASSESSMENT: Positive pregnancy test  PLAN Prenatal care to be completed at: [facility]

## 2023-03-16 ENCOUNTER — Ambulatory Visit (INDEPENDENT_AMBULATORY_CARE_PROVIDER_SITE_OTHER): Payer: Medicaid Other | Admitting: *Deleted

## 2023-03-16 ENCOUNTER — Other Ambulatory Visit (HOSPITAL_COMMUNITY)
Admission: RE | Admit: 2023-03-16 | Discharge: 2023-03-16 | Disposition: A | Discharge status: Home / Self Care | Payer: Medicaid Other | Source: Ambulatory Visit | Attending: Obstetrics and Gynecology | Admitting: Obstetrics and Gynecology

## 2023-03-16 ENCOUNTER — Other Ambulatory Visit (INDEPENDENT_AMBULATORY_CARE_PROVIDER_SITE_OTHER): Payer: Medicaid Other

## 2023-03-16 VITALS — BP 129/84 | HR 40 | Wt 254.3 lb

## 2023-03-16 DIAGNOSIS — Z1339 Encounter for screening examination for other mental health and behavioral disorders: Secondary | ICD-10-CM | POA: Diagnosis not present

## 2023-03-16 DIAGNOSIS — Z3481 Encounter for supervision of other normal pregnancy, first trimester: Secondary | ICD-10-CM

## 2023-03-16 DIAGNOSIS — Z3480 Encounter for supervision of other normal pregnancy, unspecified trimester: Secondary | ICD-10-CM

## 2023-03-16 DIAGNOSIS — O0990 Supervision of high risk pregnancy, unspecified, unspecified trimester: Secondary | ICD-10-CM

## 2023-03-16 DIAGNOSIS — Z3A08 8 weeks gestation of pregnancy: Secondary | ICD-10-CM

## 2023-03-16 DIAGNOSIS — O0991 Supervision of high risk pregnancy, unspecified, first trimester: Secondary | ICD-10-CM | POA: Diagnosis not present

## 2023-03-16 MED ORDER — PREPLUS 27-1 MG PO TABS
1.0000 | ORAL_TABLET | Freq: Every day | ORAL | 13 refills | Status: AC
Start: 2023-03-16 — End: ?

## 2023-03-16 MED ORDER — BLOOD PRESSURE KIT DEVI
1.0000 | 0 refills | Status: DC
Start: 2023-03-16 — End: 2023-10-29

## 2023-03-16 NOTE — Progress Notes (Signed)
New OB Intake  I connected with Veronica Hensley  on 03/16/23 at  8:15 AM EDT by In Person Visit and verified that I am speaking with the correct person using two identifiers. Nurse is located at [facility] and pt is located at [place].  I discussed the limitations, risks, security and privacy concerns of performing an evaluation and management service by telephone and the availability of in person appointments. I also discussed with the patient that there may be a patient responsible charge related to this service. The patient expressed understanding and agreed to proceed.  I explained I am completing New OB Intake today. We discussed EDD of 09/26/2023, by Last Menstrual Period. Pt is G2P1001. I reviewed her allergies, medications and Medical/Surgical/OB history.    Patient Active Problem List   Diagnosis Date Noted   Bradycardia, unspecified 09/18/2018   UTI due to Klebsiella species 05/24/2018   Biological false positive RPR test 05/08/2018    Concerns addressed today  Delivery Plans Plans to deliver at [facility]. Discussed the nature of our practice with multiple providers including residents and students. Due to the size of the practice, the delivering provider may not be the same as those providing prenatal care.   Patient is interested in water birth. Offered upcoming OB visit with CNM to discuss further.  MyChart/Babyscripts MyChart access verified. I explained pt will have some visits in office and some virtually. Babyscripts instructions given and order placed. Patient verifies receipt of registration text/e-mail. Account successfully created and app downloaded.  Blood Pressure Cuff/Weight Scale Blood pressure cuff ordered for patient to pick-up from [employer]. Explained after first prenatal appt pt will check weekly and document in Babyscripts. Patient does not have weight scale; patient may purchase if they desire to track weight weekly in Babyscripts.  Anatomy US Explained first  scheduled [place] will be around 19 weeks. Anatomy US scheduled for TBD at TBD.  Interested in [place]? If yes, send referral and doula dot phrase.   Is patient a candidate for Babyscripts Optimization? No - High Risk  First visit review I reviewed new OB appt with patient. Explained pt will be seen by Dr. Judd Lien at first visit. Discussed Avelina Laine genetic screening with patient. Requests Panorama and Horizon.. Routine prenatal labs  OB Panel, HgbA1C, OB Urine, GC/CC collected at Intake.    Last Pap Diagnosis  Date Value Ref Range Status  09/04/2022   Final   - Negative for intraepithelial lesion or malignancy (NILM)    Harrel Lemon, RN 03/16/2023  8:19 AM

## 2023-03-16 NOTE — Progress Notes (Signed)
Marked pulse irregularity noted. Reading from BP cuff 77bpm, then 40bpm. Confirmed with radial pulse. Pt is in no acute distress, Reports previous knowledge of irregularity, but no follow up. Dr. Donavan Foil consulted. Referral to [facility] made.

## 2023-03-16 NOTE — Patient Instructions (Signed)

## 2023-03-17 LAB — CERVICOVAGINAL ANCILLARY ONLY
Chlamydia: NEGATIVE
Comment: NEGATIVE
Comment: NORMAL
Neisseria Gonorrhea: NEGATIVE

## 2023-03-24 ENCOUNTER — Telehealth: Payer: Self-pay

## 2023-03-24 NOTE — Telephone Encounter (Signed)
Called patient to discuss elevated BP recorded in [facility] of 147/83. Patient also recorded shortness of breath.  Patient unable to check blood pressure during time of call. States that she is feeling a lot better today and will check BP when she gets home.  Advised to record reading this evening in [facility]. Patient verbalized understanding.

## 2023-03-25 ENCOUNTER — Telehealth: Payer: Self-pay

## 2023-03-25 NOTE — Telephone Encounter (Signed)
Discussed continued elevated BP readings recorded in [place] with Dr. Debroah Loop.  Dr. Debroah Loop recommends that we recheck BP at her New OB visit on 10/1. Patient does not need to check BP daily.  The only repeat BP was a follow up reading from a previously recorded elevated BP.  Called patient to inform. No answer. Left vm for patient to return call to the office.

## 2023-03-31 ENCOUNTER — Other Ambulatory Visit: Payer: Medicaid Other

## 2023-03-31 ENCOUNTER — Encounter: Payer: Medicaid Other | Admitting: Family Medicine

## 2023-04-06 ENCOUNTER — Telehealth: Payer: Self-pay

## 2023-04-06 NOTE — Telephone Encounter (Signed)
Called patient to follow up on elevated BP reading recorded in [place] of 181/88.  No answer. Left vm for patient to return call to the office.

## 2023-04-30 ENCOUNTER — Other Ambulatory Visit: Payer: Medicaid Other

## 2023-04-30 ENCOUNTER — Encounter: Payer: Medicaid Other | Admitting: Student

## 2023-04-30 NOTE — Patient Instructions (Signed)
Considering Waterbirth? Guide for patients at Center for [facility]) Why consider waterbirth? Gentle birth for babies  Less pain medicine used in labor  May allow for passive descent/less pushing  May reduce perineal tears  More mobility and instinctive maternal position changes  Increased maternal relaxation   Is waterbirth safe? What are the risks of infection, drowning or other complications? Infection:  Very low risk (3.7 % for tub vs 4.8% for bed)  7 in 8000 waterbirths with documented infection  Poorly cleaned equipment most common cause  Slightly lower group B strep transmission rate  Drowning  Maternal:  Very low risk  Related to seizures or fainting  Newborn:  Very low risk. No evidence of increased risk of respiratory problems in multiple large studies  Physiological protection from breathing under water  Avoid underwater birth if there are any fetal complications  Once baby's head is out of the water, keep it out.  Birth complication  Some reports of cord trauma, but risk decreased by bringing baby to surface gradually  No evidence of increased risk of shoulder dystocia. Mothers can usually change positions faster in water than in a bed, possibly aiding the maneuvers to free the shoulder.   There are 2 things you MUST do to have a waterbirth with [facility]: Attend a waterbirth class at [employer] [facility]   3rd Wednesday of every month from 7-9 pm (virtual during COVID) [employer] at [email or web address] or [email or web address] or by calling [phone] Bring [place] the certificate from the class to your prenatal appointment or send via MyChart Meet with a midwife at 36 weeks* to see if you can still plan a waterbirth and to sign the consent.   *We also recommend that you schedule as many of your prenatal visits with a midwife as possible.    Helpful information: You may want to bring a bathing suit top to the hospital  to wear during labor but this is optional.  All other supplies are provided by the hospital. Please arrive at the hospital with signs of active labor, and do not wait at home until late in labor. It takes 45 min- 1 hour for fetal monitoring, and check in to your room to take place, plus transport and filling of the waterbirth tub.    Things that would prevent you from having a waterbirth: Premature, <37wks  Previous cesarean birth  Presence of thick meconium-stained fluid  Multiple gestation (Twins, triplets, etc.)  Uncontrolled diabetes or gestational diabetes requiring medication  Hypertension diagnosed in pregnancy or preexisting hypertension (gestational hypertension, preeclampsia, or chronic hypertension) Fetal growth restriction (your baby measures less than 10th percentile on ultrasound) Heavy vaginal bleeding  Non-reassuring fetal heart rate  Active infection (MRSA, etc.). Group B Strep is NOT a contraindication for waterbirth.  If your labor has to be induced and induction method requires continuous monitoring of the baby's heart rate  Other risks/issues identified by your obstetrical provider   Please remember that birth is unpredictable. Under certain unforeseeable circumstances your provider may advise against giving birth in the tub. These decisions will be made on a case-by-case basis and with the safety of you and your baby as our highest priority.    Updated 10/02/21    Our practice his participating in a study that provides no-cost doula care. ACURE4Moms is a study looking at how doula care can reduce birthing disparities for Black and brown birthing people. We like to refer patients as soon as possible,  but definitely before 28 weeks so patients can get to know their doula.    A doula is trained to provide support before, during and just after you give birth. While doulas do not provide medical care, they do provide emotional, physical and educational support. Doulas can  help reduce your stress and comfort you and your partner. They can help you cope with labor by helping you use breathing techniques, massage, creative labor positioning, essential oils and affirmations.   ACURE4Moms is a research study trying to reduce:   low birthweight babies  emergency department visits & hospitalizations for birthing persons and their babies  depression among birthing people  discrimination in pregnancy-related care ACURE4Moms is trying out 2 programs designed by  people who have given birth. These programs include: 1. Sharing patient data and warning alerts with clinic staff to keep them accountable for their patients' outcomes and providing tools to help them  reduce bias in care. 2. Matching eligible patients with doulas from the  same community as the patients.  If you would like to participate in this study, please visit:   [email or web address]     Safe Medications in Pregnancy   Acne: Benzoyl Peroxide Salicylic Acid  Backache/Headache: Tylenol: 2 regular strength every 4 hours OR              2 Extra strength every 6 hours  Colds/Coughs/Allergies: Benadryl (alcohol free) 25 mg every 6 hours as needed Breath right strips Claritin Cepacol throat lozenges Chloraseptic throat spray Cold-Eeze- up to three times per day Cough drops, alcohol free Flonase (by prescription only) Guaifenesin Mucinex Robitussin DM (plain only, alcohol free) Saline nasal spray/drops Sudafed (pseudoephedrine) & Actifed ** use only after [redacted] weeks gestation and if you do not have high blood pressure Tylenol Vicks Vaporub Zinc lozenges Zyrtec   Constipation: Colace Ducolax suppositories Fleet enema Glycerin suppositories Metamucil Milk of magnesia Miralax Senokot Smooth move tea  Diarrhea: Kaopectate Imodium A-D  *NO pepto Bismol  Hemorrhoids: Anusol Anusol HC Preparation H Tucks  Indigestion: Tums Maalox Mylanta Zantac   Pepcid  Insomnia: Benadryl (alcohol free) 25mg  every 6 hours as needed Tylenol PM Unisom, no Gelcaps  Leg Cramps: Tums MagGel  Nausea/Vomiting:  Bonine Dramamine Emetrol Ginger extract Sea bands Meclizine  Nausea medication to take during pregnancy:  Unisom (doxylamine succinate 25 mg tablets) Take one tablet daily at bedtime. If symptoms are not adequately controlled, the dose can be increased to a maximum recommended dose of two tablets daily (1/2 tablet in the morning, 1/2 tablet mid-afternoon and one at bedtime). Vitamin B6 100mg  tablets. Take one tablet twice a day (up to 200 mg per day).  Skin Rashes: Aveeno products Benadryl cream or 25mg  every 6 hours as needed Calamine Lotion 1% cortisone cream  Yeast infection: Gyne-lotrimin 7 Monistat 7   **If taking multiple medications, please check labels to avoid duplicating the same active ingredients **take medication as directed on the label ** Do not exceed 4000 mg of tylenol in 24 hours **Do not take medications that contain aspirin or ibuprofen     [facility] Pediatric Providers  [place] ([id number]) [facility] Manson Passey, MD; Deirdre Priest, MD; Lum Babe, MD; Leveda Anna, MD; McDiarmid, MD; Jerene Bears, MD [address] [place] [place], [place] [address] [phone] Mon-Fri 8:30-12:30, 1:30-5:00  Providers come to see babies during newborn hospitalization Only accepting infants of Mother's who are seen at [facility] or have siblings seen at   [facility] Medicaid - Yes; Tricare -  Yes   [facility] [place], MD [address] [place], [place] [address] [phone] Mon, Tue, Thur, Fri 8:30-5:00, Wed 10:00-7:00 (closed 1-2pm daily for lunch) [facility] residents with no insurance.  [place] [employer] only with Medicaid/insurance; Tricare - no  [facility]) - [facility], MD; Manson Passey, MD; Ave Filter, MD; Luna Fuse, MD; Kennedy Bucker, MD; Florestine Avers, MD; Melchor Amour, MD; Yetta Barre,  MD; Konrad Dolores, MD; Kathlene November, MD; Jenne Campus, MD; Wynetta Emery, MD; Duffy Rhody, MD; Gerre Couch, NP [address] [place], [place], [place] [address] [phone] Mon, Tue, Thur, Fri 8:30-5:30, Wed 9:30-5:30, Sat 8:30-12:30 Only accepting infants of first-time parents or siblings of current patients Hospital discharge coordinator will make follow-up appointment Medicaid - yes; Tricare - yes  [place] [phone]) [facility] Ilean China, MD; Earlene Plater, MD; Jamesetta Orleans, MD; Alvera Novel, MD; Rana Snare, MD; [facility], MD; Shaaron Adler, MD; Hosie Poisson, MD; Mayford Knife, MD [address], [place], [place] [address] [phone] Mon-Fri 8:30-5:00, closed for lunch 12:30-1:30; Sat-Sun 10:00-1:00 Accepting Newborns with commercial insurance only, must call prior to delivery to be accepted into  practice.  Medicaid - no, Tricare - yes   [facility] [address] [place], [place] [address] [phone] or [phone] Mon to Fri 8am to 10pm, Sat 8am to 1pm (virtual only on weekends) Only accepts Medicaid Healthy Blue pts  [facility]) - Pediatrics at Elige Radon, MD; Sabino Dick, MD; Quitman Livings, MD; Betha Loa, NP; Claretha Cooper, MD; Lelon Perla, MD [address] [place] [place], [place] [address] [phone] Mon-Fri 8:30-5:30 Medicaid - yes, Tricare - yes  [place] [phone]) [facility] Marcie Mowers, MD [address], [place], [place] [address] [phone] Iona Hansen, Wed Fri 8:30-5:00, Sat 8:30-12:00, Closed Thursdays Accepting siblings of established patients and first time mom's if you call prenatally Medicaid- yes; Tricare - yes  [facility] Lutricia Feil, [place]; Tracie Harrier, MD; Rusty Aus; Scifres, PA; Wynelle Link, MD; Azucena Cecil, MD;  [address], [place], [place] [address] [phone] Mon-Fri 8:30-5:00, closed for lunch  1-2 Only accepting newborns of established patients Medicaid- no; Tricare - yes  [facility] [phone]) [facility] Morene Crocker, MD; [address], [place], [place] [address] [phone] Mon-Fri 8:00-5:00 Medicaid - No; Tricare - Yes  [facility], [place]; [place], [place] [address], [place], [place] [address] [phone] Mon-Fri 8:00-5:00 Medicaid - No, Tricare - Yes  [facility] Cardell Peach, MD; Nash Dimmer, MD; [place], [place] [address] [place], [place] [address] [phone]  Mon-Fri 8:00-5:00 Medicaid - No; Tricare - Yes  [facility] [address], [place], [place] [address] [phone] Mon-Fri 8:30-5:00 (lunch 12:00-1:00) Medicaid -Yes; Tricare - Yes  [facility] [place], MD [address] [place], [place], [place] [address] [phone] Mon-Fri 8:00-5:00 Seeing newborns of current patients only. No new patients Medicaid - No, Tricare - yes  Nature conservation officer at [place] [address] MD [address] [place], [place] [address] [phone] Mon-Fri 8:00-5:00 Medicaid -yes as secondary coverage only; Tricare - yes  [facility] [place], [place]; [place], [place]; Avis Epley, MD; Vonna Kotyk, MD; Clance Boll, MD; [place], [place]; [place], NP; Vaughan Basta, MD; [place], MD [address] [place], [place] [address] [phone] Mon-Fri 8:30-5:00, Sat 9:00-11:00 Accepts commercial insurance ONLY. Offers free prenatal information sessions for families. Medicaid - No, Tricare - Call first  [facility] [place], MD; [place], [place]; [place], [place]; [place], [place] [address] [place] [address] [phone] Mon-Fri 7:30-5:30 Medicaid - Yes; Ailene Rud yes  [place] [phone] & [phone])  [facility], MD [address] [place], [place] [address] [phone] Mon-Thur 8:00-6:00, closed for lunch 12-2, closed  Fridays Medicaid - yes; Tricare - no  [facility] Dareen Piano, NP; Cyndia Bent, MD; [place], [place]; [place], [place] [address], [place], [place] [address] [phone] Mon-Fri 7:30-4:30 Medicaid - yes, Tricare - yes  [facility]  Juanito Doom, MD; Janene Harvey, NP; Vonita Moss, MD; Donn Pierini, NP [address], [place], [place] [address] [phone] Mon-Fri 8:30-5:00, closed for lunch 1-2, Sat 8:30-12:00 - sick visits only Providers come to see babies at [facility] Only accepting newborns of siblings and first time parents ONLY if who have met with office prior to delivery Medicaid -Yes; Tricare - yes  [facility] - [place], [place]; Spero Geralds, NP; Earlene Plater, MD; Lucretia Roers, MD:  [address], [place], [place] [address] [phone] Mon- Fri 8:00-5:00, Sat 9:00-12:00 - sick visits only Accepting siblings of established patients and first time mom/baby Medicaid - Yes; Tricare - yes Patients must have vaccinations (baby vaccines)  [place] [phone] & [phone])  Adult nurse HealthCare at [facility] [address] [place], [place] [address] [phone] Mon-Fri 8:00-5:00 Medicaid - no; Tricare - yes  [facility] [place], MD; [place], [place]; [place], [place] [address], [place], [place] [address] [phone] Mon-Fri 8:00-5:00 Medicaid- yes; Tricare - yes  [facility] - Ardeen Jourdain, MD; Yetta Barre, NP; [place], [place] [address] [place], [place], [place] [address] [phone] Mon-Fri 8:00-5:00 Medicaid - Yes; Tricare - yes  [address] [place] [phone])  [facility] [place], [place]; [place], [place]; Eddie Candle, MD; Normand Sloop, MD; [place]os, NP; [place], DO; [place], [place]; Constance Goltz, MD; Ruthann Cancer, MD; Vear Clock, MD; [place], [place]; [place], [place]; [place], [place] [address] [facility] [address], [place], [place]  [address] [phone] Mon-Fri 8:30-5:00, Sat 9:00-12:00 - sick only Please register online [email or web address] then schedule online or call office Medicaid-Yes; Tricare -yes  [facility] - [place]  [place], MD; Romualdo Bolk, MD; [place], MD; [place], NP; [place]. [place], [place]; Antonietta Barcelona, MD; Mayford Knife, NP; Shelva Majestic, MD [address], [place], [place] [address] [phone] Mon-Fri 8:00-5:30, Sat&Sun by appointment (phones open at 8:30) Medicaid - Yes; Tricare - yes  [place] [phone] & [phone]) [facility] Mariel Aloe; [place], MD; Roger Shelter, MD; Arvilla Market, NP; [place], DO [address], [place], [place] [address] [phone] M-F 8:00 - 5:15, Sat/Sun 9-12 sick visits only Medicaid - No; Tricare - yes  [facility]  [place]an, PA-C; Miller, PA-C; [place], DO; [place], PA-C; [place], PA-C; Roselyn Bering, MD [address] [place], [place] [address] [phone] Mon-Thur 8:00-7:00, Fri 8:00-5:00 Accepting Medicaid for 13 and under only   [facility] Family Medicine at [place] [facility] [place] [place], [place]; List, FNP; Berneda Rose, MD; Luther Redo, PA-C; Lavonia Drafts, MD; Kellie Simmering, FNP; Genevie Cheshire, FNP; Evaristo Bury, MD; Berneda Rose, MD 205 560 [address] [place], [place] [address] [phone] Mon-Fri 8:30-5:30 Medicaid - Yes; Tricare - yes  [facility] - [address]  [place], [place]; Whitney Post, MD; Hennie Duos, MD; Wynne Dust, MD; [place], NP [address], [place], [place] [address] [phone] Mon-Thur 8:00-5:30, Fri 8:00-5:00, Sat 9:00-12:00 Medicaid - yes, Tricare - yes  [place] (  [address]Deboraha Sprang [facility], DO; Lenise Arena, MD; [place], [place] [address], [place], [place] [address] [phone] Mon-Fri 8:00-5:00, closed for lunch 12-1 Medicaid - No; Tricare - yes  Nature conservation officer at [facility], MD [address] [place], [place]  [address] [phone] Mon-Fri 8:00-5:00 Medicaid - No; Tricare - yes  [facility], MD; Tami Ribas, MD; Mariam Dollar, MD; Yetta Barre, MD [address], [place], [place] [address] [phone] Mon-Fri 8:00-5:00 Medicaid- Yes; Tricare - yes  [place] [phone])  Adult nurse HealthCare at [facility], [place]; [place], MD [address] [place] [address] [place], [place], [place] [address] [phone] Mon-Fri 8:00-5:00 Medicaid - No; Tricare - yes  [facility] - Whitney Post - CPNP [address], [place], [place] [address] [phone] Mon-Weds 8:00-6:00, Thurs-Fri 8:00-5:00, Sat 9:00-12:00 Medicaid - yes; Tricare - yes   [facility] Katharina Caper, MD; [place], [place] [address] [place], [place] [address] [phone] Mon-Fri 8:00-5:00 Medicaid - yes; Tricare - yes  [facility] Pediatric Providers  [facility] [address], [place], [place] [address] [phone] Sheral Flow: 8am -8pm, Tues, Weds: 8am - 5pm; Fri: 8-1 Medicaid - Yes; Tricare - yes  [facility] Rachel Bo, MD; Laural Benes, MD; Anner Crete, MD; [place], [place]; [place], [place] [address], [place], [place] [address] [phone] M-F 8:30 - 5:00 Medicaid - Call office; Tricare -yes  [facility] Edson Snowball, MD; Shanon Rosser, MD, Chelsea Primus, MD; Shirlyn Goltz, PNP; Wardell Heath, NP [phone] [address], [place], [place] [address] [phone] M-F 8:30 - 5:00, Sat/Sun 8:30 - 12:30 (sick visits) Medicaid - Call office; Tricare -yes  [facility] Melvyn Neth, MD; Karl Luke, PNP; Princess Bruins, MD; [place], [place]; [place], NP; Cynda Familia [address], [place], [place] [address] [phone] M-F 8:30 - 5:00 Medicaid - Call office; Tricare - yes  [facility] Jesusita Oka, MD; Dierdre Highman, MD; Earnest Conroy, MD; Timothy Lasso, MD; [place], MD [phone] [address], [place], [place] [address] [phone] M-Thur: 8:00 - 5:00; Fri: 8:00  - 4:00 Medicaid - yes; Tricare - yes  [facility] [address] [place], [place] [address] [phone] M-F: 8:30- 5:00, closed for lunch 12:30 - 1:00 Medicaid - yes; Tricare -yes  [facility] [place] [address], [place], [place] [address] [phone] M-F 8:00 - 5:00 Medicaid - yes; Tricare - yes  [place] [facility] [place], DO; [place], DO; [place], NP [address], [place], [place] [address] [phone] M-F 8:00 - 5:00, Closed 12-1 for lunch Medicaid - Call; Tricare - yes  [facility] Meredith Mody, MD [address], [place], [place] [address] [phone] M-F: 8:00-5:00, Sat: 8:00 - noon Medicaid - call; Tricare -yes  [facility] Pediatric Providers  [facility] [place], [place] [address] [place], [place], [place] [address] [phone] M-W: 8:00-5:00, Thur: 8:00 - 7:00, Fri: 8:00 - noon Medicaid - yes; Tricare - yes  [facility] - Quay Burow, FNP [address], [place], [place] [address] [phone] M-F 8:00 - 5:00, Closed for lunch 12-1 Medicaid - yes; Tricare - yes  [facility] Pediatric Providers  [facility], [place], [place], MD, [facility], FNP-C [address] [facility], [address], [place], [place] [address] [phone] M-T 8:00-5:00, Wed-Fri 7:00-6:00 Medicaid - Yes; Tricare -yes  [facility] at [facility], DO; [address], [place],  [place] [address] [phone] M-F 8:00 - 5:00, closed for lunch 12-1 Medicaid - Yes; Tricare - yes  [facility] and Internal Medicine  Zachery Dauer, MD; Gladstone Lighter, MD; Collie Siad, MD; Freda Jackson, MD; Rich Number, MD; Darryl Nestle, MD; Melinda Crutch, MD, Audria Nine, MD; Tawanna Cooler, MD; Steffanie Dunn, MD; Byrd Hesselbach, MD; Lucretia Roers, MD [address] [place], [place] [address] [phone] M-F 8:00-5:00 Medicaid - yes; Tricare - yes  [facility] [place], MD (speaks [place] and  [place]indi) [address] [place], [place] [address] [phone] M-F: 8:30 - 5:00, closed 12:30 - 1 for lunch Medicaid - Yes; Tricare -yes  [facility] Pediatric Providers  [facility] Shanda Bumps, MD; Chanetta Marshall, MD; Laurell Josephs, MD [address] [place], [place] [address] [phone] M-Th: 8:00 - 5:30, Fri: 8:00 - 12:00 Medicaid - yes; Tricare - yes  [facility] at [facility], NP; Thora Lance, MD; Orrin Brigham, MD [phone] [address] [place], [place] [address] [phone] M-F: 8:00 - 5:00 Medicaid - yes; Tricare - yes  [facility] Mettler, NP; Orson Slick, NP; Salley Scarlet, NP; Linton Flemings, MD; Mayford Knife, MD, [place], NP, Emelda Fear, MD; Nida Boatman [address] [place] [address] [place], [place] [address] [phone] M-F: 8:30 - 5:30p Medicaid - yes; Tricare - yes Other locations available as well  [facility] [place], MD; Andrey Campanile, MD; [address], PA-C [address] [place], [place] [address] [phone] M-W: 8:00am - 7:00pm, Thurs: 8:00am - 8:00pm; Fri: 8:00am - 5:00pm, closed daily from 12-1 for lunch Medicaid - yes; Tricare - yes  [facility] Pediatric Providers  [facility] at Levin Erp, MD; Aggie Cosier, FNP; Bland Span, MD; Tristan Schroeder, MD; [place], PNP; Alesia Banda; [place], [place]; Julian Reil, MD;  [address], [place], [place] [address] [phone] Judie Petit - Caleen Essex: 8am - 5pm, Sat 9-noon Medicaid - Yes; Tricare -yes  [facility] at Jaclynn Guarneri, MD; Yetta Barre, FNP; Lilian Kapur, MD; Mariam Dollar, MD [address] [place], [place] [phone] M-F 8:00 - 5:00 Medicaid - call; Tricare - yes  [facility]- Cruz Condon, MD; [place], [place]; Delora Fuel, MD; Dareen Piano, MD; Trudee Grip, MD; Kizzie Ide, MD; Zebedee Iba; Birdena Crandall, MD; Hinton Dyer, MD; [place], MD [address] [place], [place] [address] [phone] M-F 8:00am - 5:00pm;  Sat. 9:00 - 11:00 Medicaid - yes; Tricare - yes  [facility] at [facility], MD [address], [place], [place] [address] [phone] M-F 8:00 - 5:00 Medicaid - [place] Medicaid only; Tricare - yes  [facility] - Illene Bolus, MD; Earlene Plater, [place]; Kenyon Ana, MD [address] [place], [place] [address] [phone] M-F 8:00 - 5:00 Medicaid - yes; Tricare - yes  [facility] [address] [facility] - Lind Covert, MD; Manson Passey, MD, [facility], MD, [place], MD; [place], MD; Katrinka Blazing, MD; [address] [place], [place] [address] [phone] M-F: 8-5 Medicaid - yes; Tricare - yes  [facility] - [place] [place], [place]; [place], MD; [address] [place], [place] [address] [phone] M-F 8-5 Medicaid - yes; Tricare - yes  [address] [place] Darrol Poke, MD; Tami Ribas, MD; [place]-Court[place], MD; Pell[place], DNP; [place], PNP [address], [address], [place], [place] [address] [phone] M-F 8-5 Medicaid - yes; Tricare - yes  [facility] Delories Heinz, MD; Adrienne Mocha; Ala Bent, MD [address] [place], [place] [address] [phone] M-F 7am - 5 pm Medicaid - call; Tricare - yes  [facility] [place], [place]; Fredia Beets, MD; Roxan Hockey, MD [phone]  [address] [place], [place] [address] [phone] M-F 8-5 Medicaid - yes; Tricare - yes  [facility] Kae Heller, MD; Sheliah Hatch, MD; Mayford Knife, FNP; Shon Baton, FNP; Tyron Russell, FNP; Ishmael Holter; [facility] - FNP [address], [place], [place] [address] [phone] M-F 8-5 Medicaid- yes; Tricare - yes  [facility] - Betsy Coder, Lively and Chalmers Guest, MD; Terrial Rhodes, MD; Hulda Humphrey, MD; Roseanne Reno, MD; [place], [place]; Ala Dach, MD; Fredia Beets, MD; Dimple Casey, MD [address], [place], [place] [address] [phone] M-F: 8-5, Sat: 9-4, Sun 9-12 Medicaid - yes;  Tricare - yes  [facility] [place], PNP; Earlene Plater, PNP [address] [place], [place] [address] [phone] M-F 8 - 5, closed 12-1 for lunch Medicaid - yes; Tricare - yes  [facility] Kathyrn Lass, MD; Hal Neer, MD; Dimple Casey, MD; [place], DO [address], [place], [place] [address] [phone] M-F 8- 5:30 Medicaid - yes; Tricare - yes  [facility] - Biagio Quint, MD; Rosalia Hammers, MD; Gwenith Daily, MD [address] [place], [place] [address] [phone] Judie Petit: Nicholas Lose; Tues-Fri: 8-5; Sat: 9-12 Medicaid - yes; Tricare - yes  [facility] - Bobbye Morton, MD; Daphane Shepherd, MD; Chestine Spore, MD; Haskell Riling, MD; Kate Sable, MD [address], [place], [place] [address] [phone] Judie PetitMarland Kitchen Nicholas LoseFrancee Nodal: 8-5; Sat: 8:30-12:30 Medicaid - yes; Tricare - yes  [facility] - Beckey Rutter, MD; [place], [place] [address] [place], [place] [address] [phone] Mon-Fri: 8-5 Medicaid - yes; Tricare - yes  [facility] - [place], CPNP; [place], [place]; Dimple Casey, MD; Alisa Graff, MD; Cephus Shelling, MD; [address], [place], [place] [address] [phone] M-F: 8-5, closed 1-2 for lunch Medicaid - yes; Tricare - yes  [facility] - [facility] [place], [place]; [place], [place]; Katrinka Blazing, MD; [place], CPNP; [place], [place]; [place], MD; Earlene Plater, MD [address], [place], [place] [address] [phone] M-Thurs: Nicholas Lose; Fri: 8-6; Sat: 9-12; Sun 2-4 Medicaid - yes; Tricare - yes  [facility] Georgeanna Lea, MD; Evette Cristal, MD; Shea Stakes, FNP; Earney Mallet, DO; [address] [place], [place] [address] [phone] M-F: 8-5 Medicaid - yes; Tricare -  yes  [facility] Pediatric Providers  [facility] -Collene Mares, MD; [place], NP [address], [place], [place] [address] [phone] M - Fri: 8am - 5pm, closed for lunch 12-1 Medicaid - Yes; Tricare - yes  [facility] Elinor Parkinson, MD; Victory Dakin, MD; [place], DO; Vinocur, MD;Hall, PA; Clent Ridges, [place]; Orvan Falconer, NP [phone] [address], [place], [place] [address] [phone] M-F 8:00 - 5:00, Sat 8:00 - 11:30 Medicaid - yes; Tricare - yes  [facility] Flakes, MD; [place], MD, [address] MD, Wade, MD, [place], MD; [place], NP; [place], [place];  [address], [place], [place] [address] [phone] M-F 8:10am - 5:00pm Medicaid - yes; Tricare - yes  [facility] Eustaquio Boyden, MD; [place], NP [address], [place], [place] [address] [phone] M-F 8:00 - 5:00 Medicaid - [place] Medicaid only; Tricare - yes  [facility] - [place] [address] [place], MD; [place], NP [address] [place] Saner [place], [place] [address] [phone] M-F 8:00 - 5:00; Closed for lunch 12 - 1:00 Medicaid - yes; Tricare - yes  [facility] Penner,  MD; Jonita Albee, FNP [address] [place], [place] [address] [phone] Mon 9-5; Tues/Wed 10-5; Thurs 8:30-5; Fri: 8-12:30 Medicaid - yes; Tricare - yes  [facility] Pediatric Providers  [facility]  [place], MD; [place], [place] [address], [place], [place] [address] [phone] phone [phone] fax M-F 7:15 - 4:30 Medicaid - yes; Tricare - yes  [facility] Karilyn Cota, MD; [place], DO [address], [place], [place] [address] [phone] M-Fri: 8:30 - 5:00, closed for lunch everyday noon - 1pm Medicaid - Yes; Tricare - yes  [facility] Burd[place], MD; Reuel Boom, MD; Dimas Aguas, MD; Neita Carp, MD; [place], [place]; Bonnita Nasuti, [place]; [place], [place]; [place], [place]; [place], [place] [address]  [place], [place] [address] [phone] M-Thurs: 7:30am - 7:00pm; Friday 7:30am - 4pm; Sat: 8:00 - 1:00 Medicaid - Yes; Tricare - yes  [facility] [facility] Morton, MD; Conni Elliot, MD; Carroll Kinds, MD; [place], DO [address], [place], [place] [address] [phone] M-Thur: 8:00 - 5:00, Fri: 8:00 - Noon Medicaid - yes; Tricare - yes No [facility]  [place] [facility] [place], MD; Nadine Counts, DO; [place], NP; Daphine Deutscher, NP; Lequita Halt, NP; Ellamae Sia, NP; Reginia Forts, NP; Darlyn Read, MD; [place], [place] [address] [place], [place] [address] [phone] M-F 8:00 - 5:00 Medicaid - yes; Tricare - yes  [facility], FNP-C; [place], FNP-C [address] [place], [place] [address] [phone] M, W, R 8:00-5:00, Tues: 8:00am - 7:00pm; Fri 8:00 - noon Medicaid - Yes; Tricare - yes  [facility], MD [address] [place], [place] [address] [phone]  M-Thurs 8:30-5:30, Fri: 8:30-12:30pm Medicaid - Yes; Tricare - N

## 2023-05-05 NOTE — Progress Notes (Signed)
Patient did not present for appointment

## 2023-05-08 ENCOUNTER — Ambulatory Visit: Payer: Medicaid Other | Admitting: Cardiology

## 2023-05-21 ENCOUNTER — Other Ambulatory Visit: Payer: Medicaid Other

## 2023-05-21 ENCOUNTER — Ambulatory Visit: Payer: Medicaid Other | Admitting: Obstetrics and Gynecology

## 2023-05-21 VITALS — BP 136/88 | HR 89 | Wt 267.6 lb

## 2023-05-21 DIAGNOSIS — O99210 Obesity complicating pregnancy, unspecified trimester: Secondary | ICD-10-CM | POA: Insufficient documentation

## 2023-05-21 DIAGNOSIS — O280 Abnormal hematological finding on antenatal screening of mother: Secondary | ICD-10-CM

## 2023-05-21 DIAGNOSIS — Z3480 Encounter for supervision of other normal pregnancy, unspecified trimester: Secondary | ICD-10-CM

## 2023-05-21 DIAGNOSIS — Z3A18 18 weeks gestation of pregnancy: Secondary | ICD-10-CM

## 2023-05-21 DIAGNOSIS — O99810 Abnormal glucose complicating pregnancy: Secondary | ICD-10-CM

## 2023-05-21 DIAGNOSIS — Z87898 Personal history of other specified conditions: Secondary | ICD-10-CM | POA: Insufficient documentation

## 2023-05-21 DIAGNOSIS — I499 Cardiac arrhythmia, unspecified: Secondary | ICD-10-CM | POA: Insufficient documentation

## 2023-05-21 DIAGNOSIS — O99212 Obesity complicating pregnancy, second trimester: Secondary | ICD-10-CM

## 2023-05-21 MED ORDER — ASPIRIN 81 MG PO CHEW: 81.0000 mg | CHEWABLE_TABLET | Freq: Every day | ORAL | 6 refills | Status: DC

## 2023-05-21 NOTE — Patient Instructions (Signed)

## 2023-05-22 LAB — GLUCOSE TOLERANCE, 2 HOURS W/ 1HR
Glucose, 1 hour: 148 mg/dL (ref 70–179)
Glucose, 2 hour: 110 mg/dL (ref 70–152)
Glucose, Fasting: 80 mg/dL (ref 70–91)

## 2023-05-23 LAB — AFP, SERUM, OPEN SPINA BIFIDA
AFP MoM: 1.02
AFP Value: 31.2 ng/mL
Gest. Age on Collection Date: 18 wk
Maternal Age At EDD: 32.4 a
OSBR Risk 1 IN: 10000
Test Results:: NEGATIVE
Weight: 267 [lb_av]

## 2023-05-28 ENCOUNTER — Encounter: Payer: Self-pay | Admitting: Obstetrics and Gynecology

## 2023-05-28 NOTE — Progress Notes (Signed)
INITIAL OBSTETRICAL VISIT Patient name: Veronica Hensley MRN [id number]  Date of birth: 1990/09/29 Chief Complaint:   Routine Prenatal Visit  History of Present Illness:   Veronica Hensley is a 32 y.o. G13P1001 Asian female at [redacted]w[redacted]d by LMP with an Estimated Date of Delivery: 10/22/23 being seen today for her initial obstetrical visit.  Her obstetrical history is significant for obesity. This is a planned pregnancy. She reports first feeling quickening at [redacted] weeks gestation. She and the father of the baby (FOB) live together. She has a support system that consists of FOB/her family/friends. Today she reports no complaints. Doing early 2hr GTT d/t HgBA1C of 5.9  Patient's last menstrual period was 12/20/2022. Last pap 09/04/2022. Results were: normal Review of Systems:   Pertinent items are noted in HPI Denies cramping/contractions, leakage of fluid, vaginal bleeding, abnormal vaginal discharge w/ itching/odor/irritation, headaches, visual changes, shortness of breath, chest pain, abdominal pain, severe nausea/vomiting, or problems with urination or bowel movements unless otherwise stated above.  Pertinent History Reviewed:  Reviewed past medical,surgical, social, obstetrical and family history.  Reviewed problem list, medications and allergies. OB History  Gravida Para Term Preterm AB Living  2 1 1  0 0 1  SAB IAB Ectopic Multiple Live Births  0 0 0 0 1    # Outcome Date GA Lbr Len/2nd Weight Sex Type Anes PTL Lv  2 Current           1 Term 09/16/18 [redacted]w[redacted]d 02:59 / 00:25 5 lb 9.8 oz (2.545 kg) F Vag-Spont EPI  LIV   Physical Assessment:   Vitals:   05/21/23 1055  BP: 136/88  Pulse: 89  Weight: 267 lb 9.6 oz (121.4 kg)  Body mass index is 43.19 kg/m.       Physical Examination:  General appearance - well appearing, and in no distress  Mental status - alert, oriented to person, place, and time  Psych:  She has a normal mood and affect  Skin - warm and dry, normal color, no suspicious lesions  noted  Chest - effort normal, all lung fields clear to auscultation bilaterally  Heart - normal rate and regular rhythm  Abdomen - soft, nontender  Extremities:  No swelling or varicosities noted  Pelvic - VULVA: normal appearing vulva with no masses, tenderness or lesions  VAGINA: normal appearing vagina with normal color and discharge, no lesions.   CERVIX: normal appearing cervix without discharge or lesions, no CMT  Thin prep pap is not done    FHTs by doppler: 152 bpm  Assessment & Plan:  1) Low-Risk Pregnancy G2P1001 at [redacted]w[redacted]d with an Estimated Date of Delivery: 10/22/23   2) Initial OB visit - Welcomed to practice and introduced self to patient in addition to discussing other advanced practice providers that she may be seeing at this practice - Congratulated patient - Anticipatory guidance on upcoming appointments - Educated on COVID19 and pregnancy and the integration of virtual appointments  - Educated on babyscripts app- patient reports she has not received email, encouraged to look in spam folder and to call office if she still has not received email - patient verbalizes understanding    3) Supervision of other normal pregnancy, antepartum - PANORAMA PRENATAL TEST - HORIZON Basic Panel - AFP, Serum, Open Spina Bifida  4) Obesity affecting pregnancy, antepartum, unspecified obesity type - Explained necessity of needing to take bASA for prevention of hypertensive diseases in pregnancy; especially in obese women - prescription for: aspirin 81 MG chewable tablet; Chew  1 tablet (81 mg total) by mouth daily.  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    Dispense:  30 tablet    Refill:  6    Order Specific Question:   Supervising  Provider    Answer:   Reva Bores [[id number]]    Initial labs obtained Continue prenatal vitamins Reviewed n/v relief measures and warning s/s to report Reviewed recommended weight gain based on pre-gravid BMI Encouraged well-balanced diet Genetic Screening discussed: ordered Cystic fibrosis, SMA, Fragile X screening discussed ordered The nature of [facility] with multiple MDs and other Advanced Practice Providers was explained to patient; also emphasized that residents, students are part of our team.  Discussed optimized OB schedule and video visits. Advised can have an in-office visit whenever she feels she needs to be seen.  Does have own BP cuff. Check BP weekly, report BP >140/90. Advised to call during normal business hours and there is an after-hours nurse line available.   Indications for ASA therapy (per uptodate) One of the following: Previous pregnancy with preeclampsia, especially early onset and with an adverse outcome? no Multifetal gestation? no Chronic hypertension? no Type 1 or 2 diabetes mellitus? no Chronic kidney disease? no Autoimmune disease (antiphospholipid syndrome, systemic lupus erythematosus)? no   Two or more of the following: Nulliparity? no Obesity (body mass index >30 kg/m2)? yes Family history of preeclampsia in mother or sister? no Age >=35 years? no Sociodemographic characteristics (African American race, low socioeconomic level)? no Personal risk factors (eg, previous pregnancy with low birth weight or small for gestational age infant, previous adverse pregnancy outcome [eg, stillbirth], interval >10 years between pregnancies)? no   Indications for early GDM screening  First-degree relative with diabetes? no BMI >30kg/m2? yes Age > 35 years? no Previous birth of an infant weighing >=4000 g? no Gestational diabetes mellitus in a previous pregnancy? no Glycated hemoglobin >=5.7 percent (39 mmol/mol), impaired  glucose tolerance, or impaired fasting glucose on previous testing? yes High-risk race/ethnicity (eg, [place] American, Latino, Native [address], [place] American, [place] Islander)? yes Previous stillbirth of unknown cause? no Maternal birthweight > 9 lbs? no History of cardiovascular disease? no Hypertension or on therapy for hypertension? no High-density lipoprotein cholesterol level <35 mg/dL (2.95 mmol/L) and/or a triglyceride level >250 mg/dL (2.84 mmol/L)? no Polycystic ovarian syndrome (PCOS)? no Physical inactivity? no Other clinical condition associated with insulin resistance (eg, severe obesity, acanthosis nigricans)? no Current use of glucocorticoids? no  Follow-up: Return in about 4 weeks (around 06/18/2023) for Return OB visit.   Orders Placed This Encounter  Procedures   Glucose Tolerance, 2 Hours w/1 Hour   PANORAMA PRENATAL TEST   HORIZON Basic Panel   AFP, Serum, Open Spina Bifida    Raelyn Mora MSN, CNM 05/21/2023 11:30 AM

## 2023-05-31 ENCOUNTER — Encounter: Payer: Self-pay | Admitting: Obstetrics and Gynecology

## 2023-05-31 LAB — PANORAMA PRENATAL TEST FULL PANEL:PANORAMA TEST PLUS 5 ADDITIONAL MICRODELETIONS: FETAL FRACTION: 4.8

## 2023-06-02 LAB — HORIZON CUSTOM: REPORT SUMMARY: POSITIVE — AB

## 2023-06-03 ENCOUNTER — Other Ambulatory Visit: Payer: Self-pay

## 2023-06-03 ENCOUNTER — Ambulatory Visit: Payer: Medicaid Other | Admitting: *Deleted

## 2023-06-03 ENCOUNTER — Encounter: Payer: Self-pay | Admitting: *Deleted

## 2023-06-03 ENCOUNTER — Ambulatory Visit: Payer: Medicaid Other | Attending: Obstetrics and Gynecology

## 2023-06-03 ENCOUNTER — Other Ambulatory Visit: Payer: Self-pay | Admitting: *Deleted

## 2023-06-03 VITALS — BP 111/70 | HR 99

## 2023-06-03 DIAGNOSIS — Z362 Encounter for other antenatal screening follow-up: Secondary | ICD-10-CM

## 2023-06-03 DIAGNOSIS — O0990 Supervision of high risk pregnancy, unspecified, unspecified trimester: Secondary | ICD-10-CM | POA: Insufficient documentation

## 2023-06-03 DIAGNOSIS — O99210 Obesity complicating pregnancy, unspecified trimester: Secondary | ICD-10-CM

## 2023-06-03 DIAGNOSIS — O99212 Obesity complicating pregnancy, second trimester: Secondary | ICD-10-CM | POA: Diagnosis not present

## 2023-06-03 DIAGNOSIS — E669 Obesity, unspecified: Secondary | ICD-10-CM

## 2023-06-03 DIAGNOSIS — O99012 Anemia complicating pregnancy, second trimester: Secondary | ICD-10-CM | POA: Diagnosis not present

## 2023-06-03 DIAGNOSIS — D563 Thalassemia minor: Secondary | ICD-10-CM | POA: Diagnosis not present

## 2023-06-03 DIAGNOSIS — Z3A19 19 weeks gestation of pregnancy: Secondary | ICD-10-CM

## 2023-06-03 DIAGNOSIS — Z87898 Personal history of other specified conditions: Secondary | ICD-10-CM | POA: Insufficient documentation

## 2023-06-03 DIAGNOSIS — I499 Cardiac arrhythmia, unspecified: Secondary | ICD-10-CM | POA: Insufficient documentation

## 2023-06-09 ENCOUNTER — Encounter: Payer: Self-pay | Admitting: Internal Medicine

## 2023-06-10 ENCOUNTER — Encounter: Payer: Self-pay | Admitting: Obstetrics and Gynecology

## 2023-06-10 NOTE — Addendum Note (Signed)
Addended by: Kenard Gower on: 06/10/2023 01:53 PM   Modules accepted: Orders

## 2023-06-12 ENCOUNTER — Ambulatory Visit: Payer: Medicaid Other | Admitting: Cardiology

## 2023-06-18 ENCOUNTER — Ambulatory Visit (INDEPENDENT_AMBULATORY_CARE_PROVIDER_SITE_OTHER): Payer: Medicaid Other | Admitting: Obstetrics and Gynecology

## 2023-06-18 VITALS — BP 113/78 | HR 96 | Wt 268.6 lb

## 2023-06-18 DIAGNOSIS — O99212 Obesity complicating pregnancy, second trimester: Secondary | ICD-10-CM

## 2023-06-18 DIAGNOSIS — Z87898 Personal history of other specified conditions: Secondary | ICD-10-CM

## 2023-06-18 DIAGNOSIS — Z3A22 22 weeks gestation of pregnancy: Secondary | ICD-10-CM | POA: Diagnosis not present

## 2023-06-18 DIAGNOSIS — D563 Thalassemia minor: Secondary | ICD-10-CM | POA: Insufficient documentation

## 2023-06-18 DIAGNOSIS — O0992 Supervision of high risk pregnancy, unspecified, second trimester: Secondary | ICD-10-CM | POA: Diagnosis not present

## 2023-06-18 DIAGNOSIS — Z6841 Body Mass Index (BMI) 40.0 and over, adult: Secondary | ICD-10-CM | POA: Insufficient documentation

## 2023-06-18 DIAGNOSIS — O99210 Obesity complicating pregnancy, unspecified trimester: Secondary | ICD-10-CM

## 2023-06-18 DIAGNOSIS — O0990 Supervision of high risk pregnancy, unspecified, unspecified trimester: Secondary | ICD-10-CM

## 2023-06-18 HISTORY — DX: Thalassemia minor: D56.3

## 2023-06-18 NOTE — Progress Notes (Signed)
Pt presents for ROB. Desires partner testing for Alpha Thal carrier status.

## 2023-06-18 NOTE — Progress Notes (Signed)
   PRENATAL VISIT NOTE  Subjective:  Veronica Hensley is a 31 y.o. G2P1001 at [redacted]w[redacted]d being seen today for ongoing prenatal care.  She is currently monitored for the following issues for this high-risk pregnancy and has Biological false positive RPR test; UTI due to Klebsiella species; Bradycardia, unspecified; Supervision of high risk pregnancy, antepartum; Obesity affecting pregnancy, antepartum; History of low birth weight-infant 2500 g; Arrhythmia-maternal; and BMI 40.0-44.9, adult (HCC) on their problem list.  Patient doing well with no acute concerns today. She reports no complaints.  Contractions: Irritability. Vag. Bleeding: None.  Movement: Present. Denies leaking of fluid.   The following portions of the patient's history were reviewed and updated as appropriate: allergies, current medications, past family history, past medical history, past social history, past surgical history and problem list. Problem list updated.  Objective:   Vitals:   06/18/23 0941  BP: 113/78  Pulse: 96  Weight: 268 lb 9.6 oz (121.8 kg)    Fetal Status: Fetal Heart Rate (bpm): 151 Fundal Height: 21 cm Movement: Present     General:  Alert, oriented and cooperative. Patient is in no acute distress.  Skin: Skin is warm and dry. No rash noted.   Cardiovascular: Normal heart rate noted  Respiratory: Normal respiratory effort, no problems with respiration noted  Abdomen: Soft, gravid, appropriate for gestational age.  Pain/Pressure: Absent     Pelvic: Cervical exam deferred        Extremities: Normal range of motion.  Edema: None  Mental Status:  Normal mood and affect. Normal behavior. Normal judgment and thought content.   Assessment and Plan:  Pregnancy: G2P1001 at [redacted]w[redacted]d  1. [redacted] weeks gestation of pregnancy (Primary)   2. History of low birth weight-infant 2500 g Follow up scan 07/07/23, will monitor  3. Obesity affecting pregnancy, antepartum, unspecified obesity type   4. Supervision of high risk  pregnancy, antepartum Continue routine prenatal care  5. BMI 40.0-44.9, adult (HCC)  6. Silent carrier alpha thal:  FOB is getting saliva testing   Preterm labor symptoms and general obstetric precautions including but not limited to vaginal bleeding, contractions, leaking of fluid and fetal movement were reviewed in detail with the patient.  Please refer to After Visit Summary for other counseling recommendations.   Return in about 4 weeks (around 07/16/2023) for ROB, in person, 2 hr GTT, 3rd trim labs.   Mariel Aloe, MD Faculty Attending Center for [facility]

## 2023-06-22 ENCOUNTER — Telehealth: Payer: Self-pay | Admitting: Obstetrics and Gynecology

## 2023-06-22 ENCOUNTER — Ambulatory Visit: Payer: Medicaid Other | Attending: Obstetrics and Gynecology

## 2023-06-22 NOTE — Telephone Encounter (Signed)
Pt did not show for virtual genetic counseling visit. Left message to call back if having trouble connecting or would like to reschedule. We can be reached at [phone] to reschedule.  Cherly Anderson, MS, CGC

## 2023-07-01 NOTE — L&D Delivery Note (Signed)
 Delivery Note Veronica Hensley is a 33 y.o. G2P1001 at [redacted]w[redacted]d admitted for IOL for BPP 2/8.   GBS Status:  POSITIVE/-- (03/27 1726)  Labor course: Initial SVE: 2.5/50/-3. Augmentation with: AROM, Pitocin, and IP Foley. She then progressed to complete.  ROM: 2h 69m with clear fluid  Birth: After a 3 push 2nd stage, she delivered a Live born female  Birth Weight:   APGAR: 8, 9  Newborn Delivery   Birth date/time: 09/25/2023 00:01:56 Delivery type: Vaginal, Spontaneous        Delivered via spontaneous vaginal delivery (Presentation: OA ). Nuchal cord present: No. . Shoulders and body delivered in usual fashion. Infant placed directly on mom's abdomen for bonding/skin-to-skin, baby dried and stimulated. Cord clamped x 2 after 1 minute and cut by FOB.  Cord blood collected. Placenta delivered-Spontaneous with 3 vessels. 20u Pitocin in 500cc LR given as a bolus after delivery of placenta.  Fundus firm with massage. Placenta inspected and appears to be intact with a 3 VC.  Above done w/Nate , med student under my direct supervision. Sponge and instrument count were correct x2.  Intrapartum complications:  None Anesthesia:  epidural Lacerations:  none Suture Repair:  EBL (mL):475   Mom to postpartum.  Baby to Couplet care / Skin to Skin. Placenta to L&D   Plans to Breastfeed Contraception: oral progesterone-only contraceptive Circumcision: N/A  Note sent to [facility]: [facility] for pp visit.  Delivery Report:  Review the Delivery Report for details.     Signed: Jacklyn Shell, DNP,CNM 09/25/2023, 12:22 AM

## 2023-07-07 ENCOUNTER — Ambulatory Visit: Payer: Medicaid Other

## 2023-07-07 ENCOUNTER — Ambulatory Visit: Payer: Medicaid Other | Attending: Maternal & Fetal Medicine

## 2023-07-16 ENCOUNTER — Ambulatory Visit: Payer: Medicaid Other | Admitting: Obstetrics and Gynecology

## 2023-07-16 ENCOUNTER — Encounter: Payer: Self-pay | Admitting: Obstetrics and Gynecology

## 2023-07-16 VITALS — BP 125/85 | HR 102 | Wt 270.6 lb

## 2023-07-16 DIAGNOSIS — O0990 Supervision of high risk pregnancy, unspecified, unspecified trimester: Secondary | ICD-10-CM

## 2023-07-16 DIAGNOSIS — O99112 Other diseases of the blood and blood-forming organs and certain disorders involving the immune mechanism complicating pregnancy, second trimester: Secondary | ICD-10-CM

## 2023-07-16 DIAGNOSIS — Z87898 Personal history of other specified conditions: Secondary | ICD-10-CM

## 2023-07-16 DIAGNOSIS — D563 Thalassemia minor: Secondary | ICD-10-CM

## 2023-07-16 DIAGNOSIS — Z3A26 26 weeks gestation of pregnancy: Secondary | ICD-10-CM

## 2023-07-16 MED ORDER — VITAFOL GUMMIES 3.33-0.333-34.8 MG PO CHEW: 1.0000 | CHEWABLE_TABLET | Freq: Every day | ORAL | 5 refills | Status: DC

## 2023-07-16 NOTE — Progress Notes (Signed)
   PRENATAL VISIT NOTE  Subjective:  Veronica Hensley is a 33 y.o. G2P1001 at [redacted]w[redacted]d being seen today for ongoing prenatal care.  She is currently monitored for the following issues for this high-risk pregnancy and has Biological false positive RPR test; UTI due to Klebsiella species; Bradycardia, unspecified; Supervision of high risk pregnancy, antepartum; Obesity affecting pregnancy, antepartum; History of low birth weight-infant 2500 g; Arrhythmia-maternal; BMI 40.0-44.9, adult (HCC); and Alpha thalassemia silent carrier on their problem list.  Patient reports  carpal tunnel, nausea at night, sciatica pain .  Contractions: Not present. Vag. Bleeding: None.  Movement: Present. Denies leaking of fluid.   The following portions of the patient's history were reviewed and updated as appropriate: allergies, current medications, past family history, past medical history, past social history, past surgical history and problem list.   Objective:   Vitals:   07/16/23 0900  BP: 125/85  Pulse: (!) 102  Weight: 270 lb 9.6 oz (122.7 kg)    Fetal Status: Fetal Heart Rate (bpm): 161 Fundal Height: 26 cm Movement: Present     General:  Alert, oriented and cooperative. Patient is in no acute distress.  Skin: Skin is warm and dry. No rash noted.   Cardiovascular: Normal heart rate noted  Respiratory: Normal respiratory effort, no problems with respiration noted  Abdomen: Soft, gravid, appropriate for gestational age.  Pain/Pressure: Absent     Pelvic: Cervical exam deferred        Extremities: Normal range of motion.  Edema: None  Mental Status: Normal mood and affect. Normal behavior. Normal judgment and thought content.   Assessment and Plan:  Pregnancy: G2P1001 at [redacted]w[redacted]d 1. Supervision of high risk pregnancy, antepartum (Primary) BP and FHR normal Doing well, feeling regular movement    2. History of low birth weight-infant 2500 g 1/6 u/s efw 40%, normal afi, follow up growth 4 weeks  3. Alpha  thalassemia silent carrier   4. [redacted] weeks gestation of pregnancy Early gtt normal, discussed repeat GTT next visit Supportive measures discussed regarding sciatica, to include maternity belt, does not desire medication, PCP sent referral for PT that I encouraged her to try Discussed carpal tunnel, encourage wrist braces daily, discussed injections w/ PCP  Will trial otc treatment for nausea for now   Preterm labor symptoms and general obstetric precautions including but not limited to vaginal bleeding, contractions, leaking of fluid and fetal movement were reviewed in detail with the patient. Please refer to After Visit Summary for other counseling recommendations.   Return in two weeks for routine prenatal with 2hr GTT  Future Appointments  Date Time Provider Department Center  07/24/2023  4:00 PM Thomasene Ripple, DO [facility] None  07/27/2023  8:30 AM [facility] None  07/27/2023  8:55 AM Brock Bad, MD [facility] None  08/10/2023  8:55 AM Brock Bad, MD [facility] None  08/24/2023  8:55 AM Allie Bossier, MD [facility] None    Albertine Grates, FNP

## 2023-07-16 NOTE — Progress Notes (Signed)
Pt presents for ROB visit. Pt c/o nausea at night. Requesting Rx

## 2023-07-24 ENCOUNTER — Ambulatory Visit: Payer: Medicaid Other | Admitting: Cardiology

## 2023-07-27 ENCOUNTER — Ambulatory Visit (INDEPENDENT_AMBULATORY_CARE_PROVIDER_SITE_OTHER): Payer: Medicaid Other | Admitting: Obstetrics

## 2023-07-27 ENCOUNTER — Encounter: Payer: Self-pay | Admitting: Obstetrics

## 2023-07-27 ENCOUNTER — Other Ambulatory Visit: Payer: Medicaid Other

## 2023-07-27 VITALS — BP 121/81 | HR 101 | Wt 272.8 lb

## 2023-07-27 DIAGNOSIS — Z87898 Personal history of other specified conditions: Secondary | ICD-10-CM | POA: Diagnosis not present

## 2023-07-27 DIAGNOSIS — O99212 Obesity complicating pregnancy, second trimester: Secondary | ICD-10-CM

## 2023-07-27 DIAGNOSIS — O0992 Supervision of high risk pregnancy, unspecified, second trimester: Secondary | ICD-10-CM | POA: Diagnosis not present

## 2023-07-27 DIAGNOSIS — O99810 Abnormal glucose complicating pregnancy: Secondary | ICD-10-CM | POA: Diagnosis not present

## 2023-07-27 DIAGNOSIS — O99210 Obesity complicating pregnancy, unspecified trimester: Secondary | ICD-10-CM

## 2023-07-27 DIAGNOSIS — D563 Thalassemia minor: Secondary | ICD-10-CM

## 2023-07-27 DIAGNOSIS — Z3A27 27 weeks gestation of pregnancy: Secondary | ICD-10-CM

## 2023-07-27 DIAGNOSIS — O0990 Supervision of high risk pregnancy, unspecified, unspecified trimester: Secondary | ICD-10-CM

## 2023-07-27 NOTE — Progress Notes (Signed)
Pt. Presents for rob. Pt. Has no questions or concerns today.

## 2023-07-27 NOTE — Progress Notes (Signed)
Subjective:  Veronica Hensley is a 33 y.o. G2P1001 at [redacted]w[redacted]d being seen today for ongoing prenatal care.  She is currently monitored for the following issues for this high-risk pregnancy and has Biological false positive RPR test; UTI due to Klebsiella species; Bradycardia, unspecified; Supervision of high risk pregnancy, antepartum; Obesity affecting pregnancy, antepartum; History of low birth weight-infant 2500 g; Arrhythmia-maternal; BMI 40.0-44.9, adult (HCC); and Alpha thalassemia silent carrier on their problem list.  Patient reports heartburn.  Contractions: Not present. Vag. Bleeding: None.  Movement: Present. Denies leaking of fluid.   The following portions of the patient's history were reviewed and updated as appropriate: allergies, current medications, past family history, past medical history, past social history, past surgical history and problem list. Problem list updated.  Objective:   Vitals:   07/27/23 0916  BP: 121/81  Pulse: (!) 101  Weight: 272 lb 12.8 oz (123.7 kg)    Fetal Status: Fetal Heart Rate (bpm): 147   Movement: Present     General:  Alert, oriented and cooperative. Patient is in no acute distress.  Skin: Skin is warm and dry. No rash noted.   Cardiovascular: Normal heart rate noted  Respiratory: Normal respiratory effort, no problems with respiration noted  Abdomen: Soft, gravid, appropriate for gestational age. Pain/Pressure: Absent     Pelvic:  Cervical exam deferred        Extremities: Normal range of motion.  Edema: None  Mental Status: Normal mood and affect. Normal behavior. Normal judgment and thought content.   Urinalysis:      Assessment and Plan:  Pregnancy: G2P1001 at [redacted]w[redacted]d  1. Supervision of high risk pregnancy, antepartum (Primary) Rx: - Glucose Tolerance, 2 Hours w/1 Hour - CBC - HIV antibody (with reflex) - RPR  2. History of low birth weight-infant 2500 g  3. Obesity affecting pregnancy, antepartum, unspecified obesity type  4.  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 Alpha thalassemia silent carrier   Preterm labor symptoms and general obstetric precautions including but not limited to vaginal bleeding, contractions, leaking of fluid and fetal movement were reviewed in detail with the patient. Please refer to After Visit Summary for other counseling recommendations.   Return in about 2 weeks (around 08/10/2023) for [facility].   Brock Bad, MD 07/27/2023

## 2023-07-28 ENCOUNTER — Other Ambulatory Visit: Payer: Self-pay | Admitting: *Deleted

## 2023-07-28 ENCOUNTER — Other Ambulatory Visit: Payer: Self-pay

## 2023-07-28 ENCOUNTER — Ambulatory Visit: Payer: Medicaid Other | Admitting: *Deleted

## 2023-07-28 ENCOUNTER — Ambulatory Visit: Payer: Medicaid Other | Attending: Maternal & Fetal Medicine

## 2023-07-28 ENCOUNTER — Encounter: Payer: Self-pay | Admitting: *Deleted

## 2023-07-28 VITALS — BP 133/51 | HR 86

## 2023-07-28 DIAGNOSIS — O0990 Supervision of high risk pregnancy, unspecified, unspecified trimester: Secondary | ICD-10-CM | POA: Diagnosis present

## 2023-07-28 DIAGNOSIS — Z362 Encounter for other antenatal screening follow-up: Secondary | ICD-10-CM | POA: Diagnosis present

## 2023-07-28 DIAGNOSIS — E669 Obesity, unspecified: Secondary | ICD-10-CM | POA: Diagnosis not present

## 2023-07-28 DIAGNOSIS — D563 Thalassemia minor: Secondary | ICD-10-CM

## 2023-07-28 DIAGNOSIS — O99012 Anemia complicating pregnancy, second trimester: Secondary | ICD-10-CM | POA: Diagnosis not present

## 2023-07-28 DIAGNOSIS — O99212 Obesity complicating pregnancy, second trimester: Secondary | ICD-10-CM | POA: Diagnosis not present

## 2023-07-28 DIAGNOSIS — Z3A27 27 weeks gestation of pregnancy: Secondary | ICD-10-CM

## 2023-07-28 DIAGNOSIS — O99213 Obesity complicating pregnancy, third trimester: Secondary | ICD-10-CM

## 2023-07-29 ENCOUNTER — Other Ambulatory Visit: Payer: Self-pay | Admitting: Obstetrics

## 2023-07-29 DIAGNOSIS — D508 Other iron deficiency anemias: Secondary | ICD-10-CM

## 2023-07-29 LAB — CBC
Hematocrit: 36.8 % (ref 34.0–46.6)
Hemoglobin: 11.9 g/dL (ref 11.1–15.9)
MCH: 24.7 pg — ABNORMAL LOW (ref 26.6–33.0)
MCHC: 32.3 g/dL (ref 31.5–35.7)
MCV: 77 fL — ABNORMAL LOW (ref 79–97)
Platelets: 276 10*3/uL (ref 150–450)
RBC: 4.81 x10E6/uL (ref 3.77–5.28)
RDW: 13.8 % (ref 11.7–15.4)
WBC: 10.9 10*3/uL — ABNORMAL HIGH (ref 3.4–10.8)

## 2023-07-29 LAB — GLUCOSE TOLERANCE, 2 HOURS W/ 1HR
Glucose, 1 hour: 164 mg/dL (ref 70–179)
Glucose, 2 hour: 133 mg/dL (ref 70–152)
Glucose, Fasting: 84 mg/dL (ref 70–91)

## 2023-07-29 LAB — RPR: RPR Ser Ql: REACTIVE — AB

## 2023-07-29 LAB — RPR, QUANT+TP ABS (REFLEX)
Rapid Plasma Reagin, Quant: 1:1 {titer} — ABNORMAL HIGH
T Pallidum Abs: NONREACTIVE

## 2023-07-29 LAB — HIV ANTIBODY (ROUTINE TESTING W REFLEX): HIV Screen 4th Generation wRfx: NONREACTIVE

## 2023-07-29 MED ORDER — ACCRUFER 30 MG PO CAPS: 1.0000 | ORAL_CAPSULE | Freq: Two times a day (BID) | ORAL | 3 refills | Status: DC

## 2023-08-10 ENCOUNTER — Encounter: Payer: Self-pay | Admitting: Obstetrics

## 2023-08-10 ENCOUNTER — Ambulatory Visit (INDEPENDENT_AMBULATORY_CARE_PROVIDER_SITE_OTHER): Payer: Medicaid Other | Admitting: Obstetrics

## 2023-08-10 VITALS — BP 133/86 | HR 111 | Wt 276.4 lb

## 2023-08-10 DIAGNOSIS — Z87898 Personal history of other specified conditions: Secondary | ICD-10-CM | POA: Diagnosis not present

## 2023-08-10 DIAGNOSIS — O99810 Abnormal glucose complicating pregnancy: Secondary | ICD-10-CM

## 2023-08-10 DIAGNOSIS — O0990 Supervision of high risk pregnancy, unspecified, unspecified trimester: Secondary | ICD-10-CM

## 2023-08-10 DIAGNOSIS — D563 Thalassemia minor: Secondary | ICD-10-CM

## 2023-08-10 DIAGNOSIS — D508 Other iron deficiency anemias: Secondary | ICD-10-CM

## 2023-08-10 DIAGNOSIS — O99210 Obesity complicating pregnancy, unspecified trimester: Secondary | ICD-10-CM

## 2023-08-10 NOTE — Progress Notes (Signed)
 Pt presents for ROB visit. No concerns

## 2023-08-10 NOTE — Progress Notes (Signed)
 Subjective:  Veronica Hensley is a 33 y.o. G2P1001 at [redacted]w[redacted]d being seen today for ongoing prenatal care.  She is currently monitored for the following issues for this high-risk pregnancy and has Biological false positive RPR test; UTI due to Klebsiella species; Bradycardia, unspecified; Supervision of high risk pregnancy, antepartum; Obesity affecting pregnancy, antepartum; History of low birth weight-infant 2500 g; Arrhythmia-maternal; BMI 40.0-44.9, adult (HCC); and Alpha thalassemia silent carrier on their problem list.  Patient reports no complaints.  Contractions: Irritability. Vag. Bleeding: None.  Movement: Present. Denies leaking of fluid.   The following portions of the patient's history were reviewed and updated as appropriate: allergies, current medications, past family history, past medical history, past social history, past surgical history and problem list. Problem list updated.  Objective:   Vitals:   08/10/23 0902  BP: 133/86  Pulse: (!) 111  Weight: 276 lb 6.4 oz (125.4 kg)    Fetal Status: Fetal Heart Rate (bpm): 148   Movement: Present     General:  Alert, oriented and cooperative. Patient is in no acute distress.  Skin: Skin is warm and dry. No rash noted.   Cardiovascular: Normal heart rate noted  Respiratory: Normal respiratory effort, no problems with respiration noted  Abdomen: Soft, gravid, appropriate for gestational age. Pain/Pressure: Present     Pelvic:  Cervical exam deferred        Extremities: Normal range of motion.  Edema: None  Mental Status: Normal mood and affect. Normal behavior. Normal judgment and thought content.   Urinalysis:      Assessment and Plan:  Pregnancy: G2P1001 at [redacted]w[redacted]d  1. Supervision of high risk pregnancy, antepartum (Primary)  2. 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 History of low birth weight-infant 2500 g  4. Iron deficiency anemia secondary to inadequate dietary iron intake = Accrufer  Rx  5. Obesity  affecting pregnancy, antepartum, unspecified obesity type  6. Alpha thalassemia silent carrier   Preterm labor symptoms and general obstetric precautions including but not limited to vaginal bleeding, contractions, leaking of fluid and fetal movement were reviewed in detail with the patient. Please refer to After Visit Summary for other counseling recommendations.   Return in about 2 weeks (around 08/24/2023) for [facility]   Gabrielle Joiner, MD 08/10/2023

## 2023-08-24 ENCOUNTER — Telehealth: Payer: Medicaid Other | Admitting: Obstetrics & Gynecology

## 2023-08-24 DIAGNOSIS — D563 Thalassemia minor: Secondary | ICD-10-CM

## 2023-08-24 DIAGNOSIS — O99210 Obesity complicating pregnancy, unspecified trimester: Secondary | ICD-10-CM

## 2023-08-24 DIAGNOSIS — O0990 Supervision of high risk pregnancy, unspecified, unspecified trimester: Secondary | ICD-10-CM

## 2023-08-24 NOTE — Progress Notes (Signed)
 Didn't answer phone for her virtual visit

## 2023-09-02 ENCOUNTER — Other Ambulatory Visit: Payer: Self-pay

## 2023-09-02 ENCOUNTER — Ambulatory Visit: Payer: Medicaid Other | Attending: Obstetrics

## 2023-09-02 ENCOUNTER — Ambulatory Visit: Attending: Obstetrics and Gynecology | Admitting: Obstetrics and Gynecology

## 2023-09-02 DIAGNOSIS — O99013 Anemia complicating pregnancy, third trimester: Secondary | ICD-10-CM | POA: Diagnosis not present

## 2023-09-02 DIAGNOSIS — O99213 Obesity complicating pregnancy, third trimester: Secondary | ICD-10-CM

## 2023-09-02 DIAGNOSIS — E669 Obesity, unspecified: Secondary | ICD-10-CM

## 2023-09-02 DIAGNOSIS — Z3A32 32 weeks gestation of pregnancy: Secondary | ICD-10-CM

## 2023-09-02 DIAGNOSIS — E6689 Other obesity not elsewhere classified: Secondary | ICD-10-CM

## 2023-09-02 DIAGNOSIS — D563 Thalassemia minor: Secondary | ICD-10-CM

## 2023-09-02 NOTE — Progress Notes (Signed)
 Maternal-Fetal Medicine Consultation I had the pleasure of seeing Ms. Veronica Hens[facility] today at [facility]. She is G2 P1001 at 32w 6d gestation and is here for fetal growth assessment. She does not have gestational diabetes.  Blood pressure today at our office is 90/7/74 mmHg. Obstetric history significant for a term vaginal delivery. Patient reports she is not taking low-dose aspirin prophylaxis.  Ultrasound Fetal growth is appropriate for gestational age.  Amniotic fluid normal good fetal activity seen.  BMI>40 I discussed the significance of biophysical profile in pregnancy is complicated by obesity (pregravid BMI>40).  Maternal obesity increases slightly the risk of stillbirth (2.5- to 3-fold).  I explained the components of biophysical profile and if reassuring, the risk of stillbirth is less than 1 per 1,000.  Recommendations -Patient has appointments for weekly BPP from [redacted] weeks gestation.  Consultation including face-to-face (more than 50%) counseling 10 minutes.

## 2023-09-09 ENCOUNTER — Encounter (HOSPITAL_COMMUNITY): Payer: Self-pay | Admitting: Obstetrics & Gynecology

## 2023-09-09 ENCOUNTER — Inpatient Hospital Stay (HOSPITAL_COMMUNITY)

## 2023-09-09 ENCOUNTER — Inpatient Hospital Stay (HOSPITAL_COMMUNITY)
Admission: AD | Admit: 2023-09-09 | Discharge: 2023-09-09 | Disposition: A | Discharge status: Home / Self Care | Attending: Obstetrics & Gynecology | Admitting: Obstetrics & Gynecology

## 2023-09-09 ENCOUNTER — Ambulatory Visit: Payer: Medicaid Other | Attending: Obstetrics

## 2023-09-09 DIAGNOSIS — E669 Obesity, unspecified: Secondary | ICD-10-CM

## 2023-09-09 DIAGNOSIS — O99213 Obesity complicating pregnancy, third trimester: Secondary | ICD-10-CM

## 2023-09-09 DIAGNOSIS — D563 Thalassemia minor: Secondary | ICD-10-CM

## 2023-09-09 DIAGNOSIS — O99013 Anemia complicating pregnancy, third trimester: Secondary | ICD-10-CM

## 2023-09-09 DIAGNOSIS — O283 Abnormal ultrasonic finding on antenatal screening of mother: Secondary | ICD-10-CM | POA: Diagnosis not present

## 2023-09-09 DIAGNOSIS — Z3A33 33 weeks gestation of pregnancy: Secondary | ICD-10-CM

## 2023-09-09 DIAGNOSIS — Z148 Genetic carrier of other disease: Secondary | ICD-10-CM | POA: Diagnosis not present

## 2023-09-09 DIAGNOSIS — Z3689 Encounter for other specified antenatal screening: Secondary | ICD-10-CM | POA: Diagnosis not present

## 2023-09-09 DIAGNOSIS — Z369 Encounter for antenatal screening, unspecified: Secondary | ICD-10-CM | POA: Insufficient documentation

## 2023-09-09 DIAGNOSIS — O289 Unspecified abnormal findings on antenatal screening of mother: Secondary | ICD-10-CM | POA: Insufficient documentation

## 2023-09-09 NOTE — MAU Provider Note (Signed)
 History     CSN: [id number]  Arrival date and time: 09/09/23 1033   Event Date/Time   First Provider Initiated Contact with Patient 09/09/23 1136      Chief Complaint  Patient presents with   Failed BPP   HPI Ms. Veronica Hensley is a 33 y.o. year old G37P1001 female at [redacted]w[redacted]d weeks gestation who was sent to MAU from MFM for failed BPP; score 4/8. She lost points for movement and tone.Dr. Judeth Cornfield sent her from MFM for prolonged monitoring and a repeat BPP in 4-6 hours. She reports (+) FM during triage. Denies VB or LOF. She last ate a sausage, egg and cheese biscuit at 0800 and drank water at 1000. She receives [facility] with [facility]; next appt has not been scheduled. Her partner is present and contributing to the history taking.    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     Past Surgical History:  Procedure Laterality Date   NO PAST SURGERIES      Family History  Problem Relation Age of Onset   Hypertension Mother    Hypertension Father    Diabetes Father    Stroke Father    Heart Problems Father        has pacemaker    Social History   Tobacco Use   Smoking status: Former    Current packs/day: 0.00    Types: Cigarettes    Quit date: 03/03/2023    Years since quitting: 0.5   Smokeless tobacco: Never  Vaping Use   Vaping status: Never Used  Substance Use Topics   Alcohol use: Not Currently    Comment: occ   Drug use: Never    Allergies: No Known Allergies  Medications Prior to Admission  Medication Sig Dispense Refill Last Dose/Taking   aspirin 81 MG chewable tablet Chew 1 tablet (81 mg total) by mouth daily. 30 tablet 6    Blood Pressure Monitoring (BLOOD PRESSURE KIT)  DEVI 1 Device by Does not apply route once a week. (Patient not taking: Reported on 08/10/2023) 1 each 0    Ferric Maltol (ACCRUFER) 30 MG CAPS Take 1 capsule (30 mg total) by mouth 2 (two) times daily before a meal. Take 2 hrs before, or 2 hrs after a meal. 60 capsule 3    Prenatal Vit-Fe Fumarate-FA (PREPLUS) 27-1 MG TABS Take 1 tablet by mouth daily. 30 tablet 13     Review of Systems  Constitutional: Negative.   HENT: Negative.    Eyes: Negative.   Respiratory: Negative.    Cardiovascular: Negative.   Gastrointestinal: Negative.   Endocrine: Negative.   Genitourinary: Negative.   Musculoskeletal: Negative.   Skin: Negative.   Allergic/Immunologic: Negative.   Neurological: Negative.   Hematological: Negative.   Psychiatric/Behavioral: Negative.     Physical Exam   Blood pressure 135/76, pulse 100, temperature 98.1 F (36.7 C), temperature source Oral, resp. rate 16, last menstrual period  12/20/2022, SpO2 100%.  Physical Exam Vitals and nursing note reviewed.  Constitutional:      Appearance: Normal appearance. She is obese.  Cardiovascular:     Rate and Rhythm: Normal rate.  Pulmonary:     Effort: Pulmonary effort is normal.  Genitourinary:    Comments: deferred Musculoskeletal:        General: Normal range of motion.  Skin:    General: Skin is warm and dry.  Neurological:     Mental Status: She is alert and oriented to person, place, and time.  Psychiatric:        Mood and Affect: Mood normal.        Behavior: Behavior normal.        Thought Content: Thought content normal.        Judgment: Judgment normal.    REACTIVE NST - FHR: 145 bpm / moderate variability / accels present / decels absent / TOCO: Occ UC with UI noted MAU Course  Procedures  MDM Prolonged EFM Regular Diet & Snack  [place] MFM FETAL BPP WO NON STRESS Result Date: 09/09/2023 ----------------------------------------------------------------------  OBSTETRICS REPORT                       (Signed  Final 09/09/2023 04:46 pm) ---------------------------------------------------------------------- Patient Info  ID #:       [id number]                          D.O.B.:  1990/12/05 (32 yrs)(F)  Name:       Veronica Hensley                        Visit Date: 09/09/2023 02:11 pm ---------------------------------------------------------------------- Performed By  Attending:        Noralee Space MD        Ref. Address:     [address]                                                             [place], [place]                                                             [address]  Performed By:     Percell Boston          Secondary Phy.:   [facility] MAU/Triage                    RDMS  Referred By:      [facility]             [facility] ---------------------------------------------------------------------- Orders  #  Description                           Code        Ordered By  1  [place] MFM FETAL BPP WO NON               76819.01    Margery Szostak     STRESS ----------------------------------------------------------------------  #  Order #                     Accession #                Episode #  1  [id number]                   [id number]                 [id number] ---------------------------------------------------------------------- Indications  [redacted] weeks gestation of pregnancy                Z3A.33  Abnormal fetal ultrasound (BPP 4/8 earlier in  O28.9  office)  Obesity complicating pregnancy, third          O99.213  trimester (BMI 41)  Genetic carrier (Silent Alpha Thalassemia)     Z14.8  Low risk NIPS Neg AFP, 2hr GTT WNL  Encounter for antenatal screening,             Z36.9  unspecified ---------------------------------------------------------------------- Fetal Evaluation  Num Of Fetuses:         1  Fetal Heart Rate(bpm):  145  Cardiac Activity:       Observed  Presentation:            Cephalic  Placenta:               Posterior  P. Cord Insertion:      Previously seen  Amniotic Fluid  AFI FV:      Within normal limits  AFI Sum(cm)     %Tile       Largest Pocket(cm)  15.5            55          6.2  RUQ(cm)       RLQ(cm)       LUQ(cm)        LLQ(cm)  3.1           1.7           6.2            4.5 ---------------------------------------------------------------------- Biophysical Evaluation  Amniotic F.V:   Within normal limits       F. Tone:        Observed  F. Movement:    Observed                   Score:          8/8  F. Breathing:   Observed ---------------------------------------------------------------------- OB History  Gravidity:    2         Term:   1  Living:       1 ---------------------------------------------------------------------- Gestational Age  LMP:           37w 4d        Date:  12/20/22                  EDD:   09/26/23  Best:  33w 6d     Det. By:  U/S C R L  (03/16/23)    EDD:   10/22/23 ---------------------------------------------------------------------- Anatomy  Cranium:               Appears normal         Stomach:                Appears normal, left                                                                        sided  Ventricles:            Appears normal         Kidneys:                Appear normal  Thoracic:              Appears normal         Bladder:                Appears normal  Diaphragm:             Appears normal ---------------------------------------------------------------------- Cervix Uterus Adnexa  Cervix  Not visualized (advanced GA >24wks)  Uterus  No abnormality visualized.  Right Ovary  Not visualized.  Left Ovary  Not visualized.  Cul De Sac  No free fluid seen.  Adnexa  No abnormality visualized ---------------------------------------------------------------------- Impression  Patient was sent over to the MAU because of poor  biophysical profile score (4/8).  At MAU, she had reactive  NST.  Repeat ultrasound study was performed.   Amniotic fluid is  normal good fetal activity seen.  Antenatal testing is  reassuring.  BPP 8/8. ---------------------------------------------------------------------- Recommendations  Continue weekly antenatal testing till delivery . ----------------------------------------------------------------------                 Noralee Space, MD Electronically Signed Final Report   09/09/2023 04:46 pm ----------------------------------------------------------------------   [place] MFM FETAL BPP WO NON STRESS Result Date: 09/09/2023 ----------------------------------------------------------------------  OBSTETRICS REPORT                       (Signed Final 09/09/2023 10:46 am) ---------------------------------------------------------------------- Patient Info  ID #:       [id number]                          D.O.B.:  07-14-1990 (32 yrs)(F)  Name:       Veronica Hensley                        Visit Date: 09/09/2023 09:30 am ---------------------------------------------------------------------- Performed By  Attending:        Noralee Space MD        Ref. Address:     [address]                                                             [address]  [place], [place]                                                             [address]  Performed By:     Anabel Halon          [facility]  Referred By:      [facility] ---------------------------------------------------------------------- Orders  #  Description                           Code        Ordered By  1  [place] MFM FETAL BPP WO NON               76819.01    YU FANG     STRESS ----------------------------------------------------------------------  #  Order #                     Accession #                 Episode #  1  [id number]                   [id number]                 [id number] ---------------------------------------------------------------------- Indications  Obesity complicating pregnancy, third          O99.213  trimester (BMI 41)  Genetic carrier (Silent Alpha Thalassemia)     Z14.8  [redacted] weeks gestation of pregnancy                Z3A.33  Encounter for other antenatal screening        Z36.2  follow-up  Low risk NIPS Neg AFP, 2hr GTT WNL ---------------------------------------------------------------------- Fetal Evaluation  Num Of Fetuses:         1  Fetal Heart Rate(bpm):  134  Cardiac Activity:       Observed  Presentation:           Cephalic  Placenta:               Posterior  P. Cord Insertion:      Previously seen  Amniotic Fluid  AFI FV:      Within normal limits  AFI Sum(cm)     %Tile       Largest Pocket(cm)  20.93           79          6.39  RUQ(cm)       RLQ(cm)       LUQ(cm)        LLQ(cm)  6.39          4.33          5.7            4.51 ---------------------------------------------------------------------- Biophysical Evaluation  Amniotic F.V:   Pocket => 2 cm             F. Tone:        Not Observed  F. Movement:    Not Observed               Score:          4/8  F. Breathing:   Observed ---------------------------------------------------------------------- OB History  Gravidity:    2         Term:   1  Living:       1 ---------------------------------------------------------------------- Gestational Age  LMP:           37w 4d        Date:  12/20/22                  EDD:   09/26/23  Best:          33w 6d     Det. By:  U/S C R L  (03/16/23)    EDD:   10/22/23 ---------------------------------------------------------------------- Anatomy  Diaphragm:             Appears normal         Kidneys:                Appear normal  Stomach:               Appears normal, left   Bladder:                Appears normal                         sided  ---------------------------------------------------------------------- Impression  Pregravid BMI 40.  Patient returned for antenatal testing.  Amniotic fluid is normal.  Fetal movements and tone did not  meet the criteria of BPP.  Cephalic presentation.  I explained the significance of poor biophysical profile and the  association with increased risk of perinatal mortality.  I  recommended that she be evaluated the MAU for NST.  Repeat BPP should be performed in 4 to 6 hours.  MAU team was informed ---------------------------------------------------------------------- Recommendations  Continue weekly antenatal testing till delivery . ----------------------------------------------------------------------                 Noralee Space, MD Electronically Signed Final Report   09/09/2023 10:46 am ----------------------------------------------------------------------   Assessment and Plan  1. NST (non-stress test) reactive (Primary) - Category 1 tracing - (+) FM audible and felt by patient   2. Abnormal antenatal test Comments: MFM BPP = 4/8  WCC BPP = 8/8  3. [redacted] weeks gestation of pregnancy   - Discharge home - Patient scheduled appt with [facility] on Monday 09/21/2023 for ROB with GBS to be done. - Patient verbalized an understanding of the plan  of care and agrees.    Raelyn Mora, CNM 09/09/2023, 11:36 AM

## 2023-09-09 NOTE — MAU Note (Addendum)
.  Veronica Hensley is a 33 y.o. at [redacted]w[redacted]d here in MAU reporting: Sent over from MFM for fetal monitoring due to a failed BPP of 4/8 for movement and no tone. She reports she has been feeling normal movements. Feeling movements during triage. Denies VB or LOF. Reports last night while sleeping she felt short of breath. None today. Denies any other complaints.   Onset of complaint: Today Pain score: Denies pain.  Vitals:   09/09/23 1049  BP: 135/76  Pulse: 100  Resp: 16  Temp: 98.1 F (36.7 C)  SpO2: 100%     FHT: 145 initial external Lab orders placed from triage: none - fetal movement clicker given

## 2023-09-15 ENCOUNTER — Ambulatory Visit: Payer: Medicaid Other

## 2023-09-17 ENCOUNTER — Ambulatory Visit: Payer: Medicaid Other | Attending: Obstetrics

## 2023-09-17 ENCOUNTER — Other Ambulatory Visit: Payer: Self-pay | Admitting: *Deleted

## 2023-09-17 VITALS — BP 122/88 | HR 103

## 2023-09-17 DIAGNOSIS — E669 Obesity, unspecified: Secondary | ICD-10-CM | POA: Diagnosis not present

## 2023-09-17 DIAGNOSIS — D563 Thalassemia minor: Secondary | ICD-10-CM | POA: Diagnosis not present

## 2023-09-17 DIAGNOSIS — O0990 Supervision of high risk pregnancy, unspecified, unspecified trimester: Secondary | ICD-10-CM | POA: Diagnosis present

## 2023-09-17 DIAGNOSIS — O99013 Anemia complicating pregnancy, third trimester: Secondary | ICD-10-CM

## 2023-09-17 DIAGNOSIS — O99210 Obesity complicating pregnancy, unspecified trimester: Secondary | ICD-10-CM

## 2023-09-17 DIAGNOSIS — O99213 Obesity complicating pregnancy, third trimester: Secondary | ICD-10-CM | POA: Diagnosis present

## 2023-09-17 DIAGNOSIS — Z3A35 35 weeks gestation of pregnancy: Secondary | ICD-10-CM

## 2023-09-18 ENCOUNTER — Other Ambulatory Visit: Payer: Self-pay | Admitting: *Deleted

## 2023-09-18 ENCOUNTER — Other Ambulatory Visit: Payer: Self-pay | Admitting: Obstetrics and Gynecology

## 2023-09-18 DIAGNOSIS — O99213 Obesity complicating pregnancy, third trimester: Secondary | ICD-10-CM

## 2023-09-18 DIAGNOSIS — O99212 Obesity complicating pregnancy, second trimester: Secondary | ICD-10-CM

## 2023-09-21 ENCOUNTER — Encounter: Admitting: Obstetrics and Gynecology

## 2023-09-23 ENCOUNTER — Encounter: Admitting: Obstetrics and Gynecology

## 2023-09-23 ENCOUNTER — Ambulatory Visit: Payer: Medicaid Other

## 2023-09-24 ENCOUNTER — Ambulatory Visit: Payer: Medicaid Other

## 2023-09-24 ENCOUNTER — Inpatient Hospital Stay (HOSPITAL_COMMUNITY)
Admission: AD | Admit: 2023-09-24 | Discharge: 2023-09-27 | DRG: 807 | Disposition: A | Discharge status: Home / Self Care | Attending: Obstetrics and Gynecology | Admitting: Obstetrics and Gynecology

## 2023-09-24 ENCOUNTER — Encounter: Admitting: Obstetrics and Gynecology

## 2023-09-24 ENCOUNTER — Ambulatory Visit (HOSPITAL_BASED_OUTPATIENT_CLINIC_OR_DEPARTMENT_OTHER): Admitting: Obstetrics and Gynecology

## 2023-09-24 ENCOUNTER — Encounter (HOSPITAL_COMMUNITY): Payer: Self-pay | Admitting: Family Medicine

## 2023-09-24 ENCOUNTER — Inpatient Hospital Stay (HOSPITAL_COMMUNITY): Admitting: Anesthesiology

## 2023-09-24 DIAGNOSIS — O99214 Obesity complicating childbirth: Secondary | ICD-10-CM | POA: Diagnosis present

## 2023-09-24 DIAGNOSIS — O9902 Anemia complicating childbirth: Secondary | ICD-10-CM | POA: Diagnosis present

## 2023-09-24 DIAGNOSIS — O99824 Streptococcus B carrier state complicating childbirth: Secondary | ICD-10-CM | POA: Diagnosis present

## 2023-09-24 DIAGNOSIS — O368130 Decreased fetal movements, third trimester, not applicable or unspecified: Secondary | ICD-10-CM | POA: Diagnosis not present

## 2023-09-24 DIAGNOSIS — D563 Thalassemia minor: Secondary | ICD-10-CM | POA: Diagnosis present

## 2023-09-24 DIAGNOSIS — E669 Obesity, unspecified: Secondary | ICD-10-CM | POA: Diagnosis not present

## 2023-09-24 DIAGNOSIS — N390 Urinary tract infection, site not specified: Secondary | ICD-10-CM | POA: Diagnosis present

## 2023-09-24 DIAGNOSIS — Z8249 Family history of ischemic heart disease and other diseases of the circulatory system: Secondary | ICD-10-CM | POA: Diagnosis not present

## 2023-09-24 DIAGNOSIS — E66813 Obesity, class 3: Secondary | ICD-10-CM | POA: Diagnosis present

## 2023-09-24 DIAGNOSIS — Z87898 Personal history of other specified conditions: Secondary | ICD-10-CM

## 2023-09-24 DIAGNOSIS — O9962 Diseases of the digestive system complicating childbirth: Secondary | ICD-10-CM | POA: Diagnosis present

## 2023-09-24 DIAGNOSIS — I499 Cardiac arrhythmia, unspecified: Secondary | ICD-10-CM | POA: Diagnosis present

## 2023-09-24 DIAGNOSIS — Z6841 Body Mass Index (BMI) 40.0 and over, adult: Secondary | ICD-10-CM

## 2023-09-24 DIAGNOSIS — Z7982 Long term (current) use of aspirin: Secondary | ICD-10-CM

## 2023-09-24 DIAGNOSIS — Z833 Family history of diabetes mellitus: Secondary | ICD-10-CM | POA: Diagnosis not present

## 2023-09-24 DIAGNOSIS — O99210 Obesity complicating pregnancy, unspecified trimester: Secondary | ICD-10-CM | POA: Diagnosis present

## 2023-09-24 DIAGNOSIS — B9689 Other specified bacterial agents as the cause of diseases classified elsewhere: Secondary | ICD-10-CM | POA: Diagnosis present

## 2023-09-24 DIAGNOSIS — Z148 Genetic carrier of other disease: Secondary | ICD-10-CM

## 2023-09-24 DIAGNOSIS — O1404 Mild to moderate pre-eclampsia, complicating childbirth: Secondary | ICD-10-CM | POA: Diagnosis present

## 2023-09-24 DIAGNOSIS — O99013 Anemia complicating pregnancy, third trimester: Secondary | ICD-10-CM

## 2023-09-24 DIAGNOSIS — O1490 Unspecified pre-eclampsia, unspecified trimester: Secondary | ICD-10-CM | POA: Diagnosis not present

## 2023-09-24 DIAGNOSIS — E6689 Other obesity not elsewhere classified: Secondary | ICD-10-CM

## 2023-09-24 DIAGNOSIS — O99213 Obesity complicating pregnancy, third trimester: Secondary | ICD-10-CM | POA: Insufficient documentation

## 2023-09-24 DIAGNOSIS — O0990 Supervision of high risk pregnancy, unspecified, unspecified trimester: Secondary | ICD-10-CM

## 2023-09-24 DIAGNOSIS — Z9189 Other specified personal risk factors, not elsewhere classified: Principal | ICD-10-CM

## 2023-09-24 DIAGNOSIS — Z87891 Personal history of nicotine dependence: Secondary | ICD-10-CM | POA: Diagnosis not present

## 2023-09-24 DIAGNOSIS — D509 Iron deficiency anemia, unspecified: Secondary | ICD-10-CM | POA: Diagnosis present

## 2023-09-24 DIAGNOSIS — R768 Other specified abnormal immunological findings in serum: Secondary | ICD-10-CM | POA: Diagnosis present

## 2023-09-24 DIAGNOSIS — Z3A36 36 weeks gestation of pregnancy: Secondary | ICD-10-CM

## 2023-09-24 DIAGNOSIS — K219 Gastro-esophageal reflux disease without esophagitis: Secondary | ICD-10-CM | POA: Diagnosis present

## 2023-09-24 DIAGNOSIS — O99820 Streptococcus B carrier state complicating pregnancy: Secondary | ICD-10-CM | POA: Diagnosis not present

## 2023-09-24 LAB — CBC WITH DIFFERENTIAL/PLATELET
Abs Immature Granulocytes: 0.11 10*3/uL — ABNORMAL HIGH (ref 0.00–0.07)
Basophils Absolute: 0 10*3/uL (ref 0.0–0.1)
Basophils Relative: 0 %
Eosinophils Absolute: 0.2 10*3/uL (ref 0.0–0.5)
Eosinophils Relative: 2 %
HCT: 35.2 % — ABNORMAL LOW (ref 36.0–46.0)
Hemoglobin: 11.6 g/dL — ABNORMAL LOW (ref 12.0–15.0)
Immature Granulocytes: 1 %
Lymphocytes Relative: 31 %
Lymphs Abs: 3.5 10*3/uL (ref 0.7–4.0)
MCH: 24.8 pg — ABNORMAL LOW (ref 26.0–34.0)
MCHC: 33 g/dL (ref 30.0–36.0)
MCV: 75.4 fL — ABNORMAL LOW (ref 80.0–100.0)
Monocytes Absolute: 0.8 10*3/uL (ref 0.1–1.0)
Monocytes Relative: 7 %
Neutro Abs: 6.6 10*3/uL (ref 1.7–7.7)
Neutrophils Relative %: 59 %
Platelets: 203 10*3/uL (ref 150–400)
RBC: 4.67 MIL/uL (ref 3.87–5.11)
RDW: 17 % — ABNORMAL HIGH (ref 11.5–15.5)
WBC: 11.2 10*3/uL — ABNORMAL HIGH (ref 4.0–10.5)
nRBC: 0 % (ref 0.0–0.2)

## 2023-09-24 LAB — CBC
HCT: 36.4 % (ref 36.0–46.0)
Hemoglobin: 11.8 g/dL — ABNORMAL LOW (ref 12.0–15.0)
MCH: 24.5 pg — ABNORMAL LOW (ref 26.0–34.0)
MCHC: 32.4 g/dL (ref 30.0–36.0)
MCV: 75.5 fL — ABNORMAL LOW (ref 80.0–100.0)
Platelets: 225 10*3/uL (ref 150–400)
RBC: 4.82 MIL/uL (ref 3.87–5.11)
RDW: 17.1 % — ABNORMAL HIGH (ref 11.5–15.5)
WBC: 10 10*3/uL (ref 4.0–10.5)
nRBC: 0 % (ref 0.0–0.2)

## 2023-09-24 LAB — PROTEIN / CREATININE RATIO, URINE
Creatinine, Urine: 108 mg/dL
Protein Creatinine Ratio: 1.19 mg/mg{creat} — ABNORMAL HIGH (ref 0.00–0.15)
Total Protein, Urine: 128 mg/dL

## 2023-09-24 LAB — COMPREHENSIVE METABOLIC PANEL WITH GFR
ALT: 16 U/L (ref 0–44)
AST: 19 U/L (ref 15–41)
Albumin: 2.4 g/dL — ABNORMAL LOW (ref 3.5–5.0)
Alkaline Phosphatase: 106 U/L (ref 38–126)
Anion gap: 11 (ref 5–15)
BUN: 9 mg/dL (ref 6–20)
CO2: 19 mmol/L — ABNORMAL LOW (ref 22–32)
Calcium: 10.2 mg/dL (ref 8.9–10.3)
Chloride: 106 mmol/L (ref 98–111)
Creatinine, Ser: 0.77 mg/dL (ref 0.44–1.00)
GFR, Estimated: >60 mL/min (ref >60)
Glucose, Bld: 80 mg/dL (ref 70–99)
Potassium: 4.4 mmol/L (ref 3.5–5.1)
Sodium: 136 mmol/L (ref 135–145)
Total Bilirubin: 0.3 mg/dL (ref 0.0–1.2)
Total Protein: 6.7 g/dL (ref 6.5–8.1)

## 2023-09-24 LAB — TYPE AND SCREEN
ABO/RH(D): A POS
Antibody Screen: NEGATIVE

## 2023-09-24 LAB — GROUP B STREP BY PCR: Group B strep by PCR: POSITIVE — AB

## 2023-09-24 MED ORDER — LACTATED RINGERS IV SOLN: 500.0000 mL | INTRAVENOUS | Status: DC | PRN

## 2023-09-24 MED ORDER — EPHEDRINE 5 MG/ML INJ: 10.0000 mg | INTRAVENOUS | Status: DC | PRN

## 2023-09-24 MED ORDER — OXYTOCIN-SODIUM CHLORIDE 30-0.9 UT/500ML-% IV SOLN
2.5000 [IU]/h | INTRAVENOUS | Status: DC
  Filled 2023-09-24: qty 500

## 2023-09-24 MED ORDER — SOD CITRATE-CITRIC ACID 500-334 MG/5ML PO SOLN: 30.0000 mL | ORAL | Status: DC | PRN

## 2023-09-24 MED ORDER — HYDROXYZINE HCL 50 MG PO TABS: 50.0000 mg | ORAL_TABLET | Freq: Four times a day (QID) | ORAL | Status: DC | PRN

## 2023-09-24 MED ORDER — LIDOCAINE HCL (PF) 1 % IJ SOLN
INTRAMUSCULAR | Status: DC | PRN
  Administered 2023-09-24 (×2): 5 mL via EPIDURAL

## 2023-09-24 MED ORDER — OXYCODONE-ACETAMINOPHEN 5-325 MG PO TABS: 1.0000 | ORAL_TABLET | ORAL | Status: DC | PRN

## 2023-09-24 MED ORDER — ONDANSETRON HCL 4 MG/2ML IJ SOLN: 4.0000 mg | Freq: Four times a day (QID) | INTRAMUSCULAR | Status: DC | PRN

## 2023-09-24 MED ORDER — LIDOCAINE HCL (PF) 1 % IJ SOLN: 30.0000 mL | INTRAMUSCULAR | Status: DC | PRN

## 2023-09-24 MED ORDER — LACTATED RINGERS IV SOLN: 500.0000 mL | Freq: Once | INTRAVENOUS | Status: DC

## 2023-09-24 MED ORDER — OXYTOCIN-SODIUM CHLORIDE 30-0.9 UT/500ML-% IV SOLN
1.0000 m[IU]/min | INTRAVENOUS | Status: DC
  Administered 2023-09-24: 2 m[IU]/min via INTRAVENOUS

## 2023-09-24 MED ORDER — PENICILLIN G POT IN DEXTROSE 60000 UNIT/ML IV SOLN
3.0000 10*6.[IU] | INTRAVENOUS | Status: DC
  Administered 2023-09-24: 3 10*6.[IU] via INTRAVENOUS
  Filled 2023-09-24: qty 50

## 2023-09-24 MED ORDER — OXYTOCIN BOLUS FROM INFUSION
333.0000 mL | Freq: Once | INTRAVENOUS | Status: AC
  Administered 2023-09-25: 333 mL via INTRAVENOUS

## 2023-09-24 MED ORDER — SODIUM CHLORIDE 0.9 % IV SOLN
5.0000 10*6.[IU] | Freq: Once | INTRAVENOUS | Status: AC
  Administered 2023-09-24: 5 10*6.[IU] via INTRAVENOUS
  Filled 2023-09-24: qty 5

## 2023-09-24 MED ORDER — LACTATED RINGERS IV SOLN
INTRAVENOUS | Status: DC
Start: 2023-09-24 — End: 2023-09-25

## 2023-09-24 MED ORDER — ACETAMINOPHEN 325 MG PO TABS: 650.0000 mg | ORAL_TABLET | ORAL | Status: DC | PRN

## 2023-09-24 MED ORDER — PHENYLEPHRINE 80 MCG/ML (10ML) SYRINGE FOR IV PUSH (FOR BLOOD PRESSURE SUPPORT): 80.0000 ug | PREFILLED_SYRINGE | INTRAVENOUS | Status: DC | PRN

## 2023-09-24 MED ORDER — DIPHENHYDRAMINE HCL 50 MG/ML IJ SOLN: 12.5000 mg | INTRAMUSCULAR | Status: DC | PRN

## 2023-09-24 MED ORDER — OXYCODONE-ACETAMINOPHEN 5-325 MG PO TABS: 2.0000 | ORAL_TABLET | ORAL | Status: DC | PRN

## 2023-09-24 MED ORDER — TERBUTALINE SULFATE 1 MG/ML IJ SOLN: 0.2500 mg | Freq: Once | INTRAMUSCULAR | Status: DC | PRN

## 2023-09-24 MED ORDER — FENTANYL CITRATE (PF) 100 MCG/2ML IJ SOLN
50.0000 ug | INTRAMUSCULAR | Status: DC | PRN
Start: 2023-09-24 — End: 2023-09-25

## 2023-09-24 MED ORDER — FENTANYL-BUPIVACAINE-NACL 0.5-0.125-0.9 MG/250ML-% EP SOLN
12.0000 mL/h | EPIDURAL | Status: DC | PRN
  Administered 2023-09-24: 12 mL/h via EPIDURAL
  Filled 2023-09-24: qty 250

## 2023-09-24 NOTE — H&P (Addendum)
 LABOR AND DELIVERY ADMISSION HISTORY AND PHYSICAL NOTE  Veronica Hensley is a 33 y.o. female G2P1001 with IUP at [redacted]w[redacted]d presenting for IOL 2/2 BPP 2/8.   Patient reports the fetal movement as decreased . Patient reports uterine contraction activity as rare. Patient reports  vaginal bleeding as none. Patient describes fluid per vagina as None.   Patient denies headache, vision changes, chest pain, shortness of breath, right upper quadrant pain, or LE edema.  She plans on breast feeding. Her contraception plan is: oral progesterone-only contraceptive.  Prenatal History/Complications: PNC at [facility] - Obesity - Alpha thal carrier  Sono:  @[redacted]w[redacted]d , CWD, normal anatomy, cephalic presentation, posterior placenta, 27%ile, EFW 2590 g  Pregnancy complications:  Patient Active Problem List   Diagnosis Date Noted   At risk for alteration in fetal status 09/24/2023   BMI 40.0-44.9, adult (HCC) 06/18/2023   Alpha thalassemia silent carrier 06/18/2023   Obesity affecting pregnancy, antepartum 05/21/2023   History of low birth weight-infant 2500 g 05/21/2023   Arrhythmia-maternal 05/21/2023   Supervision of high risk pregnancy, antepartum 03/16/2023   Bradycardia, unspecified 09/18/2018   UTI due to Klebsiella species 05/24/2018   Biological false positive RPR test 05/08/2018    Past Medical History: Past Medical History:  Diagnosis Date   Arrhythmia    noted 03/16/23, pt reports has been present before, no follow up   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     Past Surgical History: Past Surgical History:  Procedure Laterality Date   NO PAST SURGERIES      Obstetrical History: OB History     Gravida  2   Para  1   Term  1   Preterm  0   AB  0   Living  1      SAB  0   IAB  0   Ectopic  0   Multiple  0   Live Births  1           Social History: Social History   Socioeconomic History   Marital status: Single    Spouse name: Not on file   Number of children:  Not on file   Years of education: Not on file   Highest education level: Not on file  Occupational History   Not on file  Tobacco Use   Smoking status: Former    Current packs/day: 0.00    Types: Cigarettes    Quit date: 03/03/2023    Years since quitting: 0.5   Smokeless tobacco: Never  Vaping Use   Vaping status: Never Used  Substance and Sexual Activity   Alcohol use: Not Currently    Comment: occ   Drug use: Never   Sexual activity: Yes    Partners: Male  Other Topics Concern   Not on file  Social History Narrative   Not on file   Social Drivers of Health   Financial Resource Strain: Low Risk  (09/07/2018)   Overall Financial Resource Strain (CARDIA)    Difficulty of Paying Living Expenses: Not hard at all  Food Insecurity: No Food Insecurity (09/24/2023)   Hunger Vital Sign    Worried About Running Out of Food in the Last Year: Never true    Ran Out of Food in the Last Year: Never true  Transportation Needs: No Transportation Needs (09/24/2023)   PRAPARE - Administrator, Civil Service (Medical): No    Lack of Transportation (Non-Medical): No  Physical Activity: Not on file  Stress: No Stress Concern Present (09/07/2018)   [employer] of Occupational Health - Occupational Stress Questionnaire    Feeling of Stress : Only a little  Social Connections: Not on file    Family History: Family History  Problem Relation Age of Onset   Hypertension Mother    Hypertension Father    Diabetes Father    Stroke Father    Heart Problems Father        has pacemaker    Allergies: No Known Allergies  Medications Prior to Admission  Medication Sig Dispense Refill Last Dose/Taking   Prenatal Vit-Fe Fumarate-FA (PREPLUS) 27-1 MG TABS Take 1 tablet by mouth daily. 30 tablet 13 09/24/2023   aspirin 81 MG chewable tablet Chew 1 tablet (81 mg total) by mouth daily. 30 tablet 6 More than a month   Blood Pressure Monitoring (BLOOD PRESSURE KIT) DEVI 1 Device by Does  not apply route once a week. (Patient not taking: Reported on 08/10/2023) 1 each 0    Ferric Maltol (ACCRUFER) 30 MG CAPS Take 1 capsule (30 mg total) by mouth 2 (two) times daily before a meal. Take 2 hrs before, or 2 hrs after a meal. 60 capsule 3 More than a month     Review of Systems  All systems reviewed and negative except as stated in HPI  Physical Exam BP (!) 151/67   Pulse (!) 52   Temp 98 F (36.7 C) (Oral)   Resp 18   LMP 12/20/2022   Physical Exam Constitutional:      General: She is not in acute distress.    Appearance: She is not ill-appearing.  Cardiovascular:     Rate and Rhythm: Normal rate.  Pulmonary:     Effort: Pulmonary effort is normal.  Abdominal:     Comments: Gravid  Genitourinary:    General: Normal vulva.     Rectum: Normal.  Musculoskeletal:        General: No swelling.  Skin:    General: Skin is warm and dry.  Neurological:     General: No focal deficit present.  Psychiatric:        Mood and Affect: Mood normal.   Presentation: cephalic by check  Fetal monitoring: Baseline: 145 bpm, Variability: Good {> 6 bpm), Accelerations: Reactive, and Decelerations: Absent Uterine activity: rare  Dilation: 2.5 Effacement (%): 50 Station: -3 Presentation: Vertex Exam by:: Dr. Earlene Plater  Prenatal labs: ABO, Rh: --/--/A POS (03/27 1653) Antibody: NEG (03/27 1653) Rubella: 1.91 (09/16 0941) RPR: Reactive (01/27 0910)  HBsAg: Negative (09/16 0941)  HIV: Non Reactive (01/27 0910)  GC/Chlamydia:  Neisseria Gonorrhea  Date Value Ref Range Status  03/16/2023 Negative  Final   Chlamydia  Date Value Ref Range Status  03/16/2023 Negative  Final   GBS:     Prenatal Transfer Tool  Maternal Diabetes: No Genetic Screening: Normal Maternal Ultrasounds/Referrals: Normal Fetal Ultrasounds or other Referrals:  None Maternal Substance Abuse:  No Significant Maternal Medications: ASA, iron Significant Maternal Lab Results: GBS unknown  Results for  orders placed or performed during the hospital encounter of 09/24/23 (from the past 24 hours)  CBC   Collection Time: 09/24/23  4:53 PM  Result Value Ref Range   WBC 10.0 4.0 - 10.5 K/uL   RBC 4.82 3.87 - 5.11 MIL/uL   Hemoglobin 11.8 (L) 12.0 - 15.0 g/dL   HCT 54.0 98.1 - 19.1 %   MCV 75.5 (L) 80.0 - 100.0 fL   MCH 24.5 (L) 26.0 -  34.0 pg   MCHC 32.4 30.0 - 36.0 g/dL   RDW 47.8 (H) 29.5 - 62.1 %   Platelets 225 150 - 400 K/uL   nRBC 0.0 0.0 - 0.2 %  Type and screen [facility]   Collection Time: 09/24/23  4:53 PM  Result Value Ref Range   ABO/RH(D) A POS    Antibody Screen NEG    Sample Expiration      09/27/2023,2359 Performed at [facility], [address] [place], [place] [address]     Assessment: Veronica Hensley is a 33 y.o. G2P1001 at [redacted]w[redacted]d here for IOL 2/2 non-reassuring fetal status. BPP 2/8 today.  #Labor: Discussed that babe is Cat I currently but unknown how she will tolerate labor. Given BPP 2/8, elevated risk for cesarean delivery discussed. Will proceed with balloon for cervical ripening, although already favorable, followed by pit/AROM. Discussed R/B/A of FB placement, and verbal consent obtained. Placed Cook without difficulty and balloon filled with 80 cc of saline. Mom and babe tolerated well. #Pain: IV pain meds PRN, epidural upon request #FHT: Category I #GBS/ID: PCR pending > PCN #MOF: breast feeding #MOC: oral progesterone-only contraceptive  #Elevated BP: No pre-existing diagnosis of g/cHTN at this time. Not severe range at this time. Will obtain CMP, P:C.  Joanne Gavel, MD [facility] Fellow Center for [facility], [facility]  09/24/2023, 6:14 PM

## 2023-09-24 NOTE — Anesthesia Preprocedure Evaluation (Signed)
 Anesthesia Evaluation    Airway Mallampati: II       Dental no notable dental hx.    Pulmonary neg pulmonary ROS, former smoker   Pulmonary exam normal        Cardiovascular hypertension, Normal cardiovascular exam Rhythm:Regular     Neuro/Psych  Neuromuscular disease  negative psych ROS   GI/Hepatic Neg liver ROS,GERD  ,,  Endo/Other    Class 3 obesity  Renal/GU negative Renal ROS  negative genitourinary   Musculoskeletal negative musculoskeletal ROS (+)  Left sided sciatica   Abdominal  (+) + obese  Peds  Hematology  (+) Blood dyscrasia, anemia   Anesthesia Other Findings   Reproductive/Obstetrics (+) Pregnancy Pre Eclampsia                               Anesthesia Physical Anesthesia Plan  ASA: 3  Anesthesia Plan: Epidural   Post-op Pain Management:    Induction:   PONV Risk Score and Plan:   Airway Management Planned: Natural Airway  Additional Equipment: None and Fetal Monitoring  Intra-op Plan:   Post-operative Plan:   Informed Consent: I have reviewed the patients History and Physical, chart, labs and discussed the procedure including the risks, benefits and alternatives for the proposed anesthesia with the patient or authorized representative who has indicated his/her understanding and acceptance.       Plan Discussed with: Anesthesiologist  Anesthesia Plan Comments:          Anesthesia Quick Evaluation

## 2023-09-24 NOTE — Anesthesia Procedure Notes (Signed)
 Epidural Patient location during procedure: OB Start time: 09/24/2023 11:24 PM End time: 09/24/2023 11:32 PM  Staffing Anesthesiologist: Mal Amabile, MD Performed: anesthesiologist   Preanesthetic Checklist Completed: patient identified, IV checked, site marked, risks and benefits discussed, surgical consent, monitors and equipment checked, pre-op evaluation and timeout performed  Epidural Patient position: sitting Prep: DuraPrep and site prepped and draped Patient monitoring: continuous pulse ox and blood pressure Approach: midline Location: L3-L4 Injection technique: LOR air  Needle:  Needle type: Tuohy  Needle gauge: 17 G Needle length: 9 cm and 9 Needle insertion depth: 6 cm Catheter type: closed end flexible Catheter size: 19 Gauge Catheter at skin depth: 12 cm Test dose: negative and Other  Assessment Events: blood not aspirated, no cerebrospinal fluid, injection not painful, no injection resistance, no paresthesia and negative IV test  Additional Notes Patient identified. Risks and benefits discussed including failed block, incomplete  Pain control, post dural puncture headache, nerve damage, paralysis, blood pressure Changes, nausea, vomiting, reactions to medications-both toxic and allergic and post Partum back pain. All questions were answered. Patient expressed understanding and wished to proceed. Sterile technique was used throughout procedure. Epidural site was Dressed with sterile barrier dressing. No paresthesias, signs of intravascular injection Or signs of intrathecal spread were encountered.  Patient was more comfortable after the epidural was dosed. Please see RN's note for documentation of vital signs and FHR which are stable. Reason for block:procedure for pain

## 2023-09-24 NOTE — Progress Notes (Signed)
 Maternal-Fetal Medicine Consultation I had the pleasure of seeing Ms. Ver[facility] Hens[facility].  Pregravid BMI 41.  Patient does not have gestational diabetes.  Blood pressure today at our office is 132/69 mmHg. Ultrasound Fetal growth is appropriate for gestational age.  Amniotic fluid is normal.  Fetal breathing movements tone and cross body movements did not meet the criteria of BPP.  Cephalic presentation. BPP 2/8. I counseled the patient on the findings and that for biophysical profile of 2/8 is associated with increased risk of perinatal mortality (125 per thousand). I recommended delivery now.  Patient will be going over to the L&D.  Dr. Brunetta Genera will be arranging induction of labor.  Consultation including face-to-face (more than 50%) counseling 20 minutes.

## 2023-09-24 NOTE — Progress Notes (Addendum)
 Labor Progress Note Veronica Hensley is a 33 y.o. G2P1001 at [redacted]w[redacted]d presented for IOL for 2/8 BPP. S: Resting comfortably in bed.    O:  BP (!) 161/104   Pulse 99   Temp 98.1 F (36.7 C) (Oral)   Resp 18   Ht 5\' 6"  (1.676 m)   Wt 125.4 kg   LMP 12/20/2022   BMI 44.62 kg/m  EFM: 145/moderate variability/accelerations no decles   CVE: Dilation: 6 Effacement (%): 70 Station: -2 Presentation: Vertex Exam by:: Aleta Hutch RN   A&P: 34 y.o. G2P1001 [redacted]w[redacted]d   Veronica Regulus, MD 10:04 PM  Attestation of Attending Supervision of Resident: Evaluation and management procedures were performed by the [facility] Medicine Resident under my supervision.  I have reviewed the Resident's note and chart, and I agree with the management and plan.  Resident did not complete the note. I was on service and was the attending. I was reviewing labor progress and EFM.   Abner Ables, MD, MPH, ABFM Attending Physician Faculty Practice- Center for [facility]

## 2023-09-25 ENCOUNTER — Encounter (HOSPITAL_COMMUNITY): Payer: Self-pay | Admitting: Family Medicine

## 2023-09-25 DIAGNOSIS — O1404 Mild to moderate pre-eclampsia, complicating childbirth: Secondary | ICD-10-CM

## 2023-09-25 DIAGNOSIS — Z3A36 36 weeks gestation of pregnancy: Secondary | ICD-10-CM

## 2023-09-25 DIAGNOSIS — O368130 Decreased fetal movements, third trimester, not applicable or unspecified: Secondary | ICD-10-CM

## 2023-09-25 DIAGNOSIS — O99214 Obesity complicating childbirth: Secondary | ICD-10-CM

## 2023-09-25 DIAGNOSIS — O99820 Streptococcus B carrier state complicating pregnancy: Secondary | ICD-10-CM

## 2023-09-25 LAB — CBC
HCT: 33.5 % — ABNORMAL LOW (ref 36.0–46.0)
Hemoglobin: 10.8 g/dL — ABNORMAL LOW (ref 12.0–15.0)
MCH: 24.4 pg — ABNORMAL LOW (ref 26.0–34.0)
MCHC: 32.2 g/dL (ref 30.0–36.0)
MCV: 75.8 fL — ABNORMAL LOW (ref 80.0–100.0)
Platelets: 193 10*3/uL (ref 150–400)
RBC: 4.42 MIL/uL (ref 3.87–5.11)
RDW: 16.9 % — ABNORMAL HIGH (ref 11.5–15.5)
WBC: 13.9 10*3/uL — ABNORMAL HIGH (ref 4.0–10.5)
nRBC: 0 % (ref 0.0–0.2)

## 2023-09-25 LAB — RPR: RPR Ser Ql: NONREACTIVE

## 2023-09-25 MED ORDER — ACETAMINOPHEN 325 MG PO TABS: 650.0000 mg | ORAL_TABLET | ORAL | Status: DC | PRN

## 2023-09-25 MED ORDER — ZOLPIDEM TARTRATE 5 MG PO TABS: 5.0000 mg | ORAL_TABLET | Freq: Every evening | ORAL | Status: DC | PRN

## 2023-09-25 MED ORDER — ONDANSETRON HCL 4 MG PO TABS: 4.0000 mg | ORAL_TABLET | ORAL | Status: DC | PRN

## 2023-09-25 MED ORDER — DIPHENHYDRAMINE HCL 25 MG PO CAPS: 25.0000 mg | ORAL_CAPSULE | Freq: Four times a day (QID) | ORAL | Status: DC | PRN

## 2023-09-25 MED ORDER — COCONUT OIL OIL: 1.0000 | TOPICAL_OIL | Status: DC | PRN

## 2023-09-25 MED ORDER — BENZOCAINE-MENTHOL 20-0.5 % EX AERO: 1.0000 | INHALATION_SPRAY | CUTANEOUS | Status: DC | PRN

## 2023-09-25 MED ORDER — WITCH HAZEL-GLYCERIN EX PADS: 1.0000 | MEDICATED_PAD | CUTANEOUS | Status: DC | PRN

## 2023-09-25 MED ORDER — OXYCODONE HCL 5 MG PO TABS: 5.0000 mg | ORAL_TABLET | ORAL | Status: DC | PRN

## 2023-09-25 MED ORDER — SIMETHICONE 80 MG PO CHEW: 80.0000 mg | CHEWABLE_TABLET | ORAL | Status: DC | PRN

## 2023-09-25 MED ORDER — TETANUS-DIPHTH-ACELL PERTUSSIS 5-2.5-18.5 LF-MCG/0.5 IM SUSY: 0.5000 mL | PREFILLED_SYRINGE | Freq: Once | INTRAMUSCULAR | Status: DC

## 2023-09-25 MED ORDER — IBUPROFEN 600 MG PO TABS
600.0000 mg | ORAL_TABLET | Freq: Four times a day (QID) | ORAL | Status: DC
  Administered 2023-09-25 – 2023-09-27 (×10): 600 mg via ORAL
  Filled 2023-09-25 (×10): qty 1

## 2023-09-25 MED ORDER — PRENATAL MULTIVITAMIN CH
1.0000 | ORAL_TABLET | Freq: Every day | ORAL | Status: DC
  Administered 2023-09-25 – 2023-09-27 (×3): 1 via ORAL
  Filled 2023-09-25 (×3): qty 1

## 2023-09-25 MED ORDER — SODIUM CHLORIDE 0.9% FLUSH: 3.0000 mL | INTRAVENOUS | Status: DC | PRN

## 2023-09-25 MED ORDER — SODIUM CHLORIDE 0.9% FLUSH
3.0000 mL | Freq: Two times a day (BID) | INTRAVENOUS | Status: DC
  Administered 2023-09-25 – 2023-09-26 (×2): 3 mL via INTRAVENOUS

## 2023-09-25 MED ORDER — SODIUM CHLORIDE 0.9 % IV SOLN: 250.0000 mL | INTRAVENOUS | Status: DC | PRN

## 2023-09-25 MED ORDER — NORETHINDRONE 0.35 MG PO TABS: 1.0000 | ORAL_TABLET | Freq: Every day | ORAL | 11 refills | Status: DC

## 2023-09-25 MED ORDER — MEASLES, MUMPS & RUBELLA VAC IJ SOLR: 0.5000 mL | Freq: Once | INTRAMUSCULAR | Status: DC

## 2023-09-25 MED ORDER — ONDANSETRON HCL 4 MG/2ML IJ SOLN: 4.0000 mg | INTRAMUSCULAR | Status: DC | PRN

## 2023-09-25 MED ORDER — SENNOSIDES-DOCUSATE SODIUM 8.6-50 MG PO TABS
2.0000 | ORAL_TABLET | ORAL | Status: DC
  Administered 2023-09-25 – 2023-09-27 (×3): 2 via ORAL
  Filled 2023-09-25 (×3): qty 2

## 2023-09-25 MED ORDER — DIBUCAINE (PERIANAL) 1 % EX OINT: 1.0000 | TOPICAL_OINTMENT | CUTANEOUS | Status: DC | PRN

## 2023-09-25 NOTE — Lactation Note (Signed)
 This note was copied from a baby's chart. Lactation Consultation Note  Patient Name: Veronica Hensley Date: 09/25/2023 Age:33 hours Reason for consult: L&D Initial assessment;Late-preterm 34-36.6wks;Infant < 6lbs Baby getting assessment when LC came into rm. Placed baby STS and latched in football hold to Lt. Breast. Then placed baby to Rt. Breast and latched onto nipple. Mom only BF her now 20 yr old for 2 weeks and didn't BF her as well as she should have per mom. Mom is BF/formula feeding. Mom is tired. Mom doesn't have any questions at this time. Will f/u w/mom on MBU.  Maternal Data Does the patient have breastfeeding experience prior to this delivery?: Yes How long did the patient breastfeed?: 2 weeks  Feeding    LATCH Score Latch: Grasps breast easily, tongue down, lips flanged, rhythmical sucking.  Audible Swallowing: None  Type of Nipple: Everted at rest and after stimulation  Comfort (Breast/Nipple): Soft / non-tender  Hold (Positioning): Assistance needed to correctly position infant at breast and maintain latch.  LATCH Score: 7   Lactation Tools Discussed/Used    Interventions Interventions: Assisted with latch;Skin to skin;Breast massage;Support pillows;Adjust position;Education  Discharge    Consult Status Consult Status: Follow-up from L&D Date: 09/25/23 Follow-up type: In-patient    Veronica Hensley, Diamond Nickel 09/25/2023, 1:35 AM

## 2023-09-25 NOTE — Lactation Note (Signed)
 This note was copied from a baby's chart. Lactation Consultation Note  Patient Name: Veronica Hensley [id number] Date: 09/25/2023 Age:33 hours Reason for consult: Difficult latch;Late-preterm 34-36.6wks  LC return 30 minutes later, infant swaddled in bassinet- mom elects to feed formula.  Maternal Data Has patient been taught Hand Expression?: Yes How long did the patient breastfeed?: 2 weeks  Feeding Mother's Current Feeding Choice: Breast Milk and Formula Nipple Type: Nfant Standard Flow (white)  LATCH Score Latch: Repeated attempts needed to sustain latch, nipple held in mouth throughout feeding, stimulation needed to elicit sucking reflex.  Audible Swallowing: None  Type of Nipple: Everted at rest and after stimulation  Comfort (Breast/Nipple): Soft / non-tender  Hold (Positioning): Assistance needed to correctly position infant at breast and maintain latch.  LATCH Score: 6   Lactation Tools Discussed/Used    Interventions Interventions: Breast feeding basics reviewed;Hand express;Breast compression;Hand pump;Education  Discharge Pump: Personal (Per mom has multiple pumps @ home.)  Consult Status Consult Status: Follow-up Date: 09/25/23 Follow-up type: In-patient    Veronica Hensley 09/25/2023, 3:21 PM

## 2023-09-25 NOTE — Anesthesia Postprocedure Evaluation (Signed)
 Anesthesia Post Note  Patient: Veronica Hensley  Procedure(s) Performed: AN AD HOC LABOR EPIDURAL     Patient location during evaluation: Mother Baby Anesthesia Type: Epidural Level of consciousness: awake and alert and oriented Pain management: satisfactory to patient Vital Signs Assessment: post-procedure vital signs reviewed and stable Respiratory status: respiratory function stable Cardiovascular status: stable Postop Assessment: no headache, no backache, epidural receding, patient able to bend at knees, no signs of nausea or vomiting, adequate PO intake and able to ambulate Anesthetic complications: no   No notable events documented.  Last Vitals:  Vitals:   09/25/23 0313 09/25/23 0610  BP: 128/79 134/64  Pulse: 99 (!) 51  Resp: 16 17  Temp: 36.5 C 36.8 C  SpO2: 100% 100%    Last Pain:  Vitals:   09/25/23 0713  TempSrc:   PainSc: 0-No pain   Pain Goal:                   Chestina Komatsu

## 2023-09-25 NOTE — Lactation Note (Addendum)
 This note was copied from a baby's chart. Lactation Consultation Note  Patient Name: Girl Yuli Lanigan [id number] Date: 09/25/2023 Age:33 hours Reason for consult: Difficult latch;Late-preterm 34-36.6wks P2, 36 wks, @ 14 hrs of life. Mom is breast/ bottle per low birth weight infant guideline; 5-13 birth weight. Mom has infant cross cradle @ breast, baby asleep. Offered tips to wake baby- baby returns to sleep as soon as lying next to breast. Demonstrated steps of latching, infant accepts breast into mouth- quickly asleep. Encouraged mom- leave baby StS and we can try again within the next hour. Encouraged mom milk production comes faster/easier with each baby. Milk production directly related to breast stimulation. Mom has DEBP, demonstrated pump pieces from kit create a hand pump- mom verbalizes more likely to use that- left within her reach.  LC reviewed low birth weight feeding plan as followed: -Whole feeding may not exceed 30 minutes total. -Breastfeed first for no longer than 15 minutes. -Offer formula for supplementation after for no longer than 15 minutes. -Pump to stimulate breasts for no longer than 15 minutes.    Sura Canul 09/25/2023, 2:44 PM

## 2023-09-25 NOTE — Discharge Summary (Signed)
 Postpartum Discharge Summary  Date of Service updated 09/26/2023     Patient Name: Veronica Hensley DOB: 08-31-1990 MRN: [id number]  Date of admission: 09/24/2023 Delivery date:09/25/2023 Delivering provider: Jacklyn Shell Date of discharge: 09/26/2023  Admitting diagnosis: At risk for alteration in fetal status [Z91.89] Intrauterine pregnancy: [redacted]w[redacted]d     Secondary diagnosis:  Principal Problem:   At risk for alteration in fetal status Active Problems:   Biological false positive RPR test   UTI due to Klebsiella species   Supervision of high risk pregnancy, antepartum   Obesity affecting pregnancy, antepartum   History of low birth weight-infant 2500 g   Arrhythmia-maternal   BMI 40.0-44.9, adult (HCC)   Alpha thalassemia silent carrier   NSVD (normal spontaneous vaginal delivery)   Preeclampsia   Microcytic anemia  Additional problems: Mild preeclampsia without severe features    Discharge diagnosis: Preterm Pregnancy Delivered                                              Post partum procedures: None Augmentation: AROM, Pitocin, and IP Foley Complications: None  Hospital course: Induction of Labor With Vaginal Delivery   33 y.o. yo [id number] at [redacted]w[redacted]d was admitted to the hospital 09/24/2023 for induction of labor.  Indication for induction:  Nonreactive NST and BPP of 2/8 .  Patient had an labor course uncomplicated.  Membrane Rupture Time/Date: 9:40 PM,09/24/2023  Delivery Method:Vaginal, Spontaneous Operative Delivery:N/A Episiotomy: None Lacerations:  None Details of delivery can be found in separate delivery note.  Patient had a postpartum course uncomplicated. Patient is discharged home 09/26/23.  Newborn Data: Birth date:09/25/2023 Birth time:12:01 AM Gender:Female Living status:Living Apgars:8 ,9  Weight:2660 g  Magnesium Sulfate received: No BMZ received: No Rhophylac:N/A MMR:No T-DaP: declined Flu: No RSV Vaccine received:  No Transfusion:No  Immunizations received: Immunization History  Administered Date(s) Administered   Influenza,inj,Quad PF,6+ Mos 09/13/2018   PFIZER(Purple Top)SARS-COV-2 Vaccination 08/20/2020, 09/10/2020   Tdap 09/13/2018    Physical exam  Vitals:   09/25/23 0904 09/25/23 1354 09/25/23 2010 09/26/23 0547  BP: 123/66 (P) 123/62 123/60 113/65  Pulse: (!) 56  (!) 51 94  Resp: 20 20 19 18   Temp: 97.7 F (36.5 C)  98 F (36.7 C) 97.6 F (36.4 C)  TempSrc: Oral  Oral Oral  SpO2:   98%   Weight:      Height:       General: alert, cooperative, and no distress Lochia: appropriate Uterine Fundus: firm Incision: N/A DVT Evaluation: No evidence of DVT seen on physical exam. Negative Homan's sign. No cords or calf tenderness. Labs: Lab Results  Component Value Date   WBC 13.9 (H) 09/25/2023   HGB 10.8 (L) 09/25/2023   HCT 33.5 (L) 09/25/2023   MCV 75.8 (L) 09/25/2023   PLT 193 09/25/2023      Latest Ref Rng & Units 09/24/2023    6:31 PM  CMP  Glucose 70 - 99 mg/dL 80   BUN 6 - 20 mg/dL 9   Creatinine 5.36 - 6.44 mg/dL 0.34   Sodium 742 - 595 mmol/L 136   Potassium 3.5 - 5.1 mmol/L 4.4   Chloride 98 - 111 mmol/L 106   CO2 22 - 32 mmol/L 19   Calcium 8.9 - 10.3 mg/dL 63.8   Total Protein 6.5 - 8.1 g/dL 6.7   Total Bilirubin 0.0 -  1.2 mg/dL 0.3   Alkaline Phos 38 - 126 U/L 106   AST 15 - 41 U/L 19   ALT 0 - 44 U/L 16    Edinburgh Score:    09/25/2023    2:18 AM  Edinburgh Postnatal Depression Scale Screening Tool  I have been able to laugh and see the funny side of things. 1  I have looked forward with enjoyment to things. 0  I have blamed myself unnecessarily when things went wrong. 1  I have been anxious or worried for no good reason. 0  I have felt scared or panicky for no good reason. 0  Things have been getting on top of me. 1  I have been so unhappy that I have had difficulty sleeping. 0  I have felt sad or miserable. 0  I have been so unhappy that I  have been crying. 1  The thought of harming myself has occurred to me. 0  Edinburgh Postnatal Depression Scale Total 4   Edinburgh Postnatal Depression Scale Total: 4   After visit meds:  Allergies as of 09/26/2023   No Known Allergies      Medication List     STOP taking these medications    aspirin 81 MG chewable tablet       TAKE these medications    ACCRUFeR 30 MG Caps Generic drug: Ferric Maltol Take 1 capsule (30 mg total) by mouth 2 (two) times daily before a meal. Take 2 hrs before, or 2 hrs after a meal.   acetaminophen 325 MG tablet Commonly known as: Tylenol Take 2 tablets (650 mg total) by mouth every 4 (four) hours as needed (for pain scale < 4).   Blood Pressure Kit Devi 1 Device by Does not apply route once a week.   coconut oil Oil Apply 1 Application topically as needed.   ibuprofen 600 MG tablet Commonly known as: ADVIL Take 1 tablet (600 mg total) by mouth every 6 (six) hours.   norethindrone 0.35 MG tablet Commonly known as: Ortho Micronor Take 1 tablet (0.35 mg total) by mouth daily.   PrePLUS 27-1 MG Tabs Take 1 tablet by mouth daily.         Discharge home in stable condition Infant Feeding: Breast Infant Disposition:{CHL IP OB HOME WITH [id number]} Discharge instruction: per After Visit Summary and Postpartum booklet. Activity: Advance as tolerated. Pelvic rest for 6 weeks.  Diet: routine diet Future Appointments: Future Appointments  Date Time Provider Department Center  10/05/2023  1:20 PM [facility] NURSE [facility] None  10/27/2023  1:10 PM Adam Phenix, MD [facility] None   Follow up Visit:  Follow-up Information     [facility] for [facility]. Schedule an appointment as soon as possible for a visit.   Specialty: Obstetrics and Gynecology Why: Routine postpartum care in 4-6 weeks Contact information: [address], [address] [place] [address] [phone]        [facility] for [employer] at [place]. Schedule an appointment as soon as possible for a visit in 1 week(s).   Specialty: Obstetrics and Gynecology Why: Postpartum blood pressure check in 1 week Contact information: [address], [address] [place] [address] [phone]               Message sent to [facility] 3/29  Please schedule this patient for a In person postpartum visit in 4 weeks with the following provider: Any provider. Additional Postpartum  F/U:Postpartum Depression checkup and BP check 1 week  High risk pregnancy complicated by: HTN and Obesity Delivery mode:  Vaginal, Spontaneous Anticipated Birth Control:  POPs   09/26/2023 Wyn Forster, MD

## 2023-09-25 NOTE — Lactation Note (Signed)
 This note was copied from a baby's chart. Lactation Consultation Note  Patient Name: Veronica Hensley [id number] Date: 09/25/2023 Age:33 hours Reason for consult: Initial assessment;Infant < 6lbs;Late-preterm 34-36.6wks LPI information sheet given and highlighted information. Reviewed supplementation w/mom. Baby hadn't been supplemented yet. [facility] taught dad how to formula feed. Discussed DEBP and mom pumping. Mom in agreement. Mom shown how to use DEBP & how to disassemble, clean, & reassemble parts. Mom pumped not collecting thing at this time. Mom encouraged to feed baby 8-12 times/24 hours and with feeding cues.  LPI newborn, importance of STS and I&O reviewed. Answered questions parents had. Encouraged mom to tell staff and write down when mom BF then formula feeds. Encouraged mom to call for assistance as needed.   Maternal Data Does the patient have breastfeeding experience prior to this delivery?: Yes How long did the patient breastfeed?: 2 weeks  Feeding    LATCH Score Latch: Grasps breast easily, tongue down, lips flanged, rhythmical sucking.  Audible Swallowing: None  Type of Nipple: Everted at rest and after stimulation  Comfort (Breast/Nipple): Soft / non-tender  Hold (Positioning): Assistance needed to correctly position infant at breast and maintain latch.  LATCH Score: 7   Lactation Tools Discussed/Used Tools: Pump Breast pump type: Double-Electric Breast Pump Pump Education: Setup, frequency, and cleaning;Milk Storage Reason for Pumping: LPI Pumping frequency: Q3 hr Pumped volume: 0 mL  Interventions Interventions: Breast feeding basics reviewed;DEBP;Education;Pace feeding;LC Services brochure;Infant Driven Feeding Algorithm education;LPT handout/interventions  Discharge    Consult Status Consult Status: Follow-up Date: 09/25/23 Follow-up type: In-patient    Charyl Dancer 09/25/2023, 3:36 AM

## 2023-09-26 ENCOUNTER — Other Ambulatory Visit (HOSPITAL_COMMUNITY): Payer: Self-pay

## 2023-09-26 DIAGNOSIS — O1490 Unspecified pre-eclampsia, unspecified trimester: Secondary | ICD-10-CM

## 2023-09-26 DIAGNOSIS — D509 Iron deficiency anemia, unspecified: Secondary | ICD-10-CM | POA: Diagnosis present

## 2023-09-26 HISTORY — DX: Unspecified pre-eclampsia, unspecified trimester: O14.90

## 2023-09-26 MED ORDER — ACETAMINOPHEN 325 MG PO TABS: 650.0000 mg | ORAL_TABLET | ORAL | Status: DC | PRN

## 2023-09-26 MED ORDER — COCONUT OIL OIL: 1.0000 | TOPICAL_OIL | Status: DC | PRN

## 2023-09-26 MED ORDER — NORETHINDRONE 0.35 MG PO TABS
1.0000 | ORAL_TABLET | Freq: Every day | ORAL | 11 refills | Status: DC
  Filled 2023-09-26: qty 28, 28d supply, fill #0

## 2023-09-26 MED ORDER — IBUPROFEN 600 MG PO TABS
600.0000 mg | ORAL_TABLET | Freq: Four times a day (QID) | ORAL | 0 refills | Status: DC
  Filled 2023-09-26: qty 30, 8d supply, fill #0

## 2023-09-26 NOTE — Progress Notes (Signed)
 POSTPARTUM PROGRESS NOTE  Subjective: Veronica Hensley is a 32 y.o. [id number] s/p SVDat [redacted]w[redacted]d.  She reports she doing well. No acute events overnight. She denies any problems with ambulating, voiding or po intake. Denies nausea or vomiting. She has passed flatus. Pain is well controlled.  Lochia is appropriate.  Objective: Blood pressure 113/65, pulse 94, temperature 97.6 F (36.4 C), temperature source Oral, resp. rate 18, height 5\' 6"  (1.676 m), weight 125.4 kg, last menstrual period 12/20/2022, SpO2 98%, unknown if currently breastfeeding.  Physical Exam:  General: alert, cooperative and no distress Chest: no respiratory distress Abdomen: soft, non-tender  Uterine Fundus: firm and at level of umbilicus Extremities: No calf swelling or tenderness  no edema  Recent Labs    09/24/23 2242 09/25/23 0444  HGB 11.6* 10.8*  HCT 35.2* 33.5*    Assessment/Plan: Veronica Hensley is a 33 y.o. [id number] s/p SVD at [redacted]w[redacted]d for IOL due to 2/2 BPP 2/8.  Routine Postpartum Care: Doing well, pain well-controlled.  -- Continue routine care, lactation support  -- Contraception: POCs -- Feeding: breast --Pre-eclampsia: Bp well controlled post partum. Baby scripts ordered and 1 week follow up scheduled for BP check.  Dispo: Plan for discharge tomorrow given preterm delivery.   Denton Ar, DO Faculty Practice, Center for [facility] 09/26/2023 6:11 AM

## 2023-09-27 NOTE — Progress Notes (Signed)
 Ms Picone was signed up for PP hypertension babyscripts on 09/26/23. She is currently unable to log into babyscripts. Pt has called [employer] and left a message and will follow up with [employer] on 09/28/23. Prenatal hypertension babyscripts was functioning prior to delivery. Ms Cranfield will continue to monitor her BP twice daily, normal and abnormal BP range are reviewed. Pt states abnormal BP range and symptoms to call MD.

## 2023-10-01 ENCOUNTER — Other Ambulatory Visit

## 2023-10-01 ENCOUNTER — Ambulatory Visit

## 2023-10-05 ENCOUNTER — Ambulatory Visit: Admitting: *Deleted

## 2023-10-05 VITALS — BP 122/80 | HR 96 | Wt 266.0 lb

## 2023-10-05 DIAGNOSIS — Z0130 Encounter for examination of blood pressure without abnormal findings: Secondary | ICD-10-CM

## 2023-10-05 DIAGNOSIS — O1490 Unspecified pre-eclampsia, unspecified trimester: Secondary | ICD-10-CM

## 2023-10-05 NOTE — Progress Notes (Signed)
 Subjective:  Crissie Aloi is a 33 y.o. female here for BP check.   Hypertension ROS: no TIA's, no chest pain on exertion, no dyspnea on exertion, and no swelling of ankles.    Objective:  Wt 266 lb (120.7 kg)   LMP 12/20/2022   Breastfeeding Yes   BMI 42.93 kg/m   Appearance alert, well appearing, and in no distress and oriented to person, place, and time. General exam BP noted to be well controlled today in office.    Assessment:   Blood Pressure well controlled.   Plan:  Current treatment plan is effective, no change in therapy..  Doing well. Denies anxiety or depression symptoms. Infant doing well. Breast and bottle feeding. Pt reports infant is sleeping pretty well and she is "on her schedule" so she is getting enough sleep. Pt is not wearing "I Gave Birth" bracelet. She does have one. Encouraged to wear bracelet or at least have it with her when she goes out.

## 2023-10-07 ENCOUNTER — Ambulatory Visit

## 2023-10-07 ENCOUNTER — Other Ambulatory Visit

## 2023-10-27 ENCOUNTER — Ambulatory Visit: Admitting: Obstetrics & Gynecology

## 2023-10-29 ENCOUNTER — Encounter: Payer: Self-pay | Admitting: Obstetrics and Gynecology

## 2023-10-29 ENCOUNTER — Ambulatory Visit: Admitting: Obstetrics and Gynecology

## 2023-10-29 DIAGNOSIS — O1495 Unspecified pre-eclampsia, complicating the puerperium: Secondary | ICD-10-CM | POA: Diagnosis not present

## 2023-10-29 DIAGNOSIS — O1490 Unspecified pre-eclampsia, unspecified trimester: Secondary | ICD-10-CM

## 2023-10-29 MED ORDER — NORETHINDRONE 0.35 MG PO TABS: 1.0000 | ORAL_TABLET | Freq: Every day | ORAL | 4 refills | Status: AC

## 2023-10-29 NOTE — Progress Notes (Addendum)
 Post Partum Visit Note  Aryelle Tischler is a 33 y.o. G56P1102 female who presents for a postpartum visit. She is 5 week postpartum following a normal spontaneous vaginal delivery.  I have fully reviewed the prenatal and intrapartum course. The delivery was at 36 gestational weeks.  Anesthesia: epidural. Postpartum course has been going well. Baby is doing well yes. Baby is feeding by both breast and bottle - Similac Neosure. Bleeding staining only. Bowel function is normal. Bladder function is normal. Patient is not sexually active. Contraception method is oral progesterone-only contraceptive. Postpartum depression screening: negative.   Upstream - 10/29/23 1316       Pregnancy Intention Screening   Does the patient want to become pregnant in the next year? No    Does the patient's partner want to become pregnant in the next year? No    Would the patient like to discuss contraceptive options today? Yes      Contraception Wrap Up   Current Method Oral Contraceptive    End Method Oral Contraceptive    Contraception Counseling Provided Yes    How was the end contraceptive method provided? Prescription            The pregnancy intention screening data noted above was reviewed. Potential methods of contraception were discussed. The patient elected to proceed with Oral Contraceptive.   Edinburgh Postnatal Depression Scale - 10/29/23 0947       Edinburgh Postnatal Depression Scale:  In the Past 7 Days   I have been able to laugh and see the funny side of things. 0    I have looked forward with enjoyment to things. 0    I have blamed myself unnecessarily when things went wrong. 0    I have been anxious or worried for no good reason. 0    I have felt scared or panicky for no good reason. 0    Things have been getting on top of me. 0    I have been so unhappy that I have had difficulty sleeping. 0    I have felt sad or miserable. 0    I have been so unhappy that I have been crying. 0    The  thought of harming myself has occurred to me. 0    Edinburgh Postnatal Depression Scale Total 0             Health Maintenance Due  Topic Date Due   COVID-19 Vaccine (3 - 2024-25 season) 03/01/2023    The following portions of the patient's history were reviewed and updated as appropriate: allergies, current medications, past family history, past medical history, past social history, past surgical history, and problem list.  Review of Systems Pertinent items are noted in HPI.  Objective:  BP 117/81   Pulse 82   Wt 259 lb 12.8 oz (117.8 kg)   LMP 10/27/2023 (Exact Date)   Breastfeeding Yes   BMI 41.93 kg/m    General:  alert, cooperative, and no distress   Breasts:  not indicated  Lungs: Normal effort  Heart:  Normal rate   Assessment:   Postpartum exam -     norethindrone  (ORTHO MICRONOR ) 0.35 MG tablet; Take 1 tablet (0.35 mg total) by mouth daily.  Pre-eclampsia, antepartum Normotensive today without medications Reviewed elevated risk for cardiovascular disease throughout lifetime and close monitoring of BP with PCP  Plan:   Essential components of care per ACOG recommendations:  1.  Mood and well being: Patient with negative depression  screening today. Reviewed local resources for support.  - Patient tobacco use? No.  Quit with +UPT and has not restarted. Congratulated today. - hx of drug use? No.    2. Infant care and feeding:  -Patient currently breastmilk feeding? Yes. Reviewed importance of draining breast regularly to support lactation.  -Social determinants of health (SDOH) reviewed in EPIC. No concerns  3. Sexuality, contraception and birth spacing - Patient does not want a pregnancy in the next year.  Desired family size is unsure.  - Reviewed reproductive life planning. Reviewed contraceptive methods based on pt preferences and effectiveness.  Patient desired Oral Contraceptive today.  Discussed OCP vs POP and she would like to continue POP -  Discussed birth spacing of 18 months  4. Sleep and fatigue -Encouraged family/partner/community support of 4 hrs of uninterrupted sleep to help with mood and fatigue  5. Physical Recovery  - Discussed patients delivery and complications. She describes her labor as good. - Patient had a Vaginal, no problems at delivery. Patient had no laceration. Perineal healing reviewed. Patient expressed understanding - Patient has urinary incontinence? No. - Patient is safe to resume physical and sexual activity  6.  Health Maintenance - HM due items addressed Yes - Last pap smear  Diagnosis  Date Value Ref Range Status  09/04/2022   Final   - Negative for intraepithelial lesion or malignancy (NILM)   Pap smear not done at today's visit.  -Breast Cancer screening indicated? No.   7. Chronic Disease/Pregnancy Condition follow up:  - As above - PCP follow up, close monitoring for HTN/CVD throughout lifetime - Discussed PCP follow up for bradycardia/palpitations but patient reports this is not currently an issue and her HR is normal today  Izell Marsh, MD Center for [employer], [facility]

## 2023-10-29 NOTE — Progress Notes (Signed)
 Pt states she has no questions or concerns at this time.

## 2023-10-30 ENCOUNTER — Ambulatory Visit: Admitting: Orthopaedic Surgery

## 2024-01-14 ENCOUNTER — Ambulatory Visit (INDEPENDENT_AMBULATORY_CARE_PROVIDER_SITE_OTHER): Admitting: Orthopedic Surgery

## 2024-01-14 ENCOUNTER — Other Ambulatory Visit (INDEPENDENT_AMBULATORY_CARE_PROVIDER_SITE_OTHER): Payer: Self-pay

## 2024-01-14 ENCOUNTER — Encounter: Payer: Self-pay | Admitting: Orthopedic Surgery

## 2024-01-14 DIAGNOSIS — G8929 Other chronic pain: Secondary | ICD-10-CM

## 2024-01-14 DIAGNOSIS — M5442 Lumbago with sciatica, left side: Secondary | ICD-10-CM | POA: Diagnosis not present

## 2024-01-14 NOTE — Progress Notes (Signed)
 Office Visit Note   Patient: Veronica Hensley           Date of Birth: 1990/11/08           MRN: [id number] Visit Date: 01/14/2024              Requested by: Theophilus Andrews, Tully GRADE, MD [address] [place],  [place] [address] PCP: Theophilus Andrews, Tully GRADE, MD  Chief Complaint  Patient presents with   Lower Back - Pain      HPI: Patient is a 33 year old woman who presents with a 2-year history of lower back pain with lateral radicular pain to the lateral aspect of her left foot.  Patient states she has tried acupuncture for a year and a half.  Patient states she has numbness on the lateral side of her foot.  Chronic midline lower back pain.  Assessment & Plan: Visit Diagnoses:  1. Chronic bilateral low back pain with left-sided sciatica     Plan: Patient is currently breast-feeding she will take Tylenol  occasionally.  No nonsteroidals or steroids.  Will request an MRI scan to further evaluate her lumbar spine.  Follow-Up Instructions: No follow-ups on file.   Ortho Exam  Patient is alert, oriented, no adenopathy, well-dressed, normal affect, normal respiratory effort. Examination patient has an antalgic gait.  She has a negative straight leg raise on the left.  No focal motor weakness.    Imaging: XR Lumbar Spine 2-3 Views Result Date: 01/14/2024 2 view radiographs of the lumbar spine shows joint space narrowing at L5-S1 without pars defect without spondylolisthesis.  No compression fractures.  No images are attached to the encounter.  Labs: Lab Results  Component Value Date   HGBA1C 5.9 (H) 03/16/2023     Lab Results  Component Value Date   ALBUMIN 2.4 (L) 09/24/2023    No results found for: MG No results found for: VD25OH  No results found for: PREALBUMIN    Latest Ref Rng & Units 09/25/2023    4:44 AM 09/24/2023   10:42 PM 09/24/2023    4:53 PM  CBC EXTENDED  WBC 4.0 - 10.5 K/uL 13.9  11.2  10.0   RBC 3.87 - 5.11 MIL/uL 4.42  4.67  4.82    Hemoglobin 12.0 - 15.0 g/dL 89.1  88.3  88.1   HCT 36.0 - 46.0 % 33.5  35.2  36.4   Platelets 150 - 400 K/uL 193  203  225   NEUT# 1.7 - 7.7 K/uL  6.6    Lymph# 0.7 - 4.0 K/uL  3.5       There is no height or weight on file to calculate BMI.  Orders:  Orders Placed This Encounter  Procedures   XR Lumbar Spine 2-3 Views   MR Lumbar Spine w/o contrast   No orders of the defined types were placed in this encounter.    Procedures: No procedures performed  Clinical Data: No additional findings.  ROS:  All other systems negative, except as noted in the HPI. Review of Systems  Objective: Vital Signs: There were no vitals taken for this visit.  Specialty Comments:  No specialty comments available.  PMFS History: Patient Active Problem List   Diagnosis Date Noted   BMI 40.0-44.9, adult (HCC) 06/18/2023   Past Medical History:  Diagnosis Date   Alpha thalassemia silent carrier 06/18/2023   Arrhythmia    noted 03/16/23, pt reports has been present before, no follow up   Bradycardia, unspecified 09/18/2018  Preeclampsia 09/26/2023   Without severe features     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     Family History  Problem Relation Age of Onset   Hypertension Mother    Hypertension Father    Diabetes Father    Stroke Father    Heart Problems Father        has pacemaker    Past Surgical History:  Procedure Laterality Date   NO PAST SURGERIES     Social History   Occupational History   Not on file  Tobacco Use   Smoking status: Former    Current packs/day: 0.00    Types: Cigarettes    Quit date: 03/03/2023    Years since quitting: 0.8   Smokeless tobacco: Never  Vaping Use   Vaping status: Never Used  Substance and Sexual Activity   Alcohol use: Not Currently    Comment: occ   Drug use: Never   Sexual activity: Yes    Partners: Male

## 2024-01-25 ENCOUNTER — Encounter: Payer: Self-pay | Admitting: Orthopedic Surgery

## 2024-02-03 ENCOUNTER — Telehealth: Payer: Self-pay | Admitting: Orthopedic Surgery

## 2024-02-03 NOTE — Telephone Encounter (Signed)
 [facility] called and said that her authorization was denied and if you are willing to do the peer to peer. CB#[phone] ext [phone]

## 2024-02-03 NOTE — Telephone Encounter (Signed)
 Do you want to have pt do 6 weeks pf physical therapy and then try to have approved again?

## 2024-02-05 NOTE — Telephone Encounter (Signed)
 I called pt and lm on vm to advise of message below. Advised I could make an appt at [facility] out patient neuro rehab or if there was another PT office that she wanted to use we could set up where ever she wanted to go. Will hold pending return call. (Pt can not go upstairs to to insurance)

## 2024-02-08 ENCOUNTER — Other Ambulatory Visit

## 2024-02-09 ENCOUNTER — Other Ambulatory Visit: Payer: Self-pay

## 2024-02-09 DIAGNOSIS — G8929 Other chronic pain: Secondary | ICD-10-CM

## 2024-02-09 NOTE — Telephone Encounter (Signed)
 I put order in for PT for [facility] neuro rehab pt can call to make an appt.

## 2024-02-09 NOTE — Telephone Encounter (Signed)
 Dr. Harden spoke with Dr. Landy for P2P and it was recommended pt to do PT 6 weeks.

## 2024-02-09 NOTE — Telephone Encounter (Signed)
 We talked about this pt this morning correct? Working on a peer to peer?

## 2024-03-17 ENCOUNTER — Ambulatory Visit: Attending: Orthopedic Surgery

## 2024-05-02 ENCOUNTER — Encounter: Payer: Self-pay | Admitting: Radiology
